# Patient Record
Sex: Male | Born: 1975 | Race: Black or African American | Hispanic: No | Marital: Single | Smoking: Current every day smoker
Health system: Southern US, Community
[De-identification: ages and names within clinical notes are randomized; demographics above are authoritative.]

## PROBLEM LIST (undated history)

## (undated) DIAGNOSIS — I10 Essential (primary) hypertension: Secondary | ICD-10-CM

## (undated) DIAGNOSIS — J45909 Unspecified asthma, uncomplicated: Secondary | ICD-10-CM

---

## 2001-08-05 ENCOUNTER — Emergency Department (HOSPITAL_COMMUNITY): Admission: EM | Admit: 2001-08-05 | Discharge: 2001-08-05 | Payer: Self-pay | Admitting: Internal Medicine

## 2004-05-08 ENCOUNTER — Emergency Department (HOSPITAL_COMMUNITY): Admission: EM | Admit: 2004-05-08 | Discharge: 2004-05-08 | Payer: Self-pay | Admitting: Emergency Medicine

## 2004-05-22 ENCOUNTER — Emergency Department (HOSPITAL_COMMUNITY): Admission: EM | Admit: 2004-05-22 | Discharge: 2004-05-22 | Payer: Self-pay | Admitting: Emergency Medicine

## 2004-09-30 ENCOUNTER — Ambulatory Visit (HOSPITAL_COMMUNITY): Admission: RE | Admit: 2004-09-30 | Discharge: 2004-09-30 | Payer: Self-pay | Admitting: Family Medicine

## 2004-10-01 ENCOUNTER — Ambulatory Visit: Payer: Self-pay | Admitting: Orthopedic Surgery

## 2004-10-16 ENCOUNTER — Ambulatory Visit: Payer: Self-pay | Admitting: Orthopedic Surgery

## 2007-03-07 ENCOUNTER — Emergency Department (HOSPITAL_COMMUNITY): Admission: EM | Admit: 2007-03-07 | Discharge: 2007-03-07 | Payer: Self-pay | Admitting: Emergency Medicine

## 2007-11-09 ENCOUNTER — Emergency Department (HOSPITAL_COMMUNITY): Admission: EM | Admit: 2007-11-09 | Discharge: 2007-11-09 | Payer: Self-pay | Admitting: Emergency Medicine

## 2007-11-22 ENCOUNTER — Emergency Department (HOSPITAL_COMMUNITY): Admission: EM | Admit: 2007-11-22 | Discharge: 2007-11-22 | Payer: Self-pay | Admitting: Emergency Medicine

## 2008-01-30 ENCOUNTER — Emergency Department (HOSPITAL_COMMUNITY): Admission: EM | Admit: 2008-01-30 | Discharge: 2008-01-30 | Payer: Self-pay | Admitting: Emergency Medicine

## 2008-05-11 ENCOUNTER — Emergency Department (HOSPITAL_COMMUNITY): Admission: EM | Admit: 2008-05-11 | Discharge: 2008-05-11 | Payer: Self-pay | Admitting: Emergency Medicine

## 2008-05-15 ENCOUNTER — Emergency Department (HOSPITAL_COMMUNITY): Admission: EM | Admit: 2008-05-15 | Discharge: 2008-05-16 | Payer: Self-pay | Admitting: Emergency Medicine

## 2008-11-03 ENCOUNTER — Emergency Department (HOSPITAL_COMMUNITY): Admission: EM | Admit: 2008-11-03 | Discharge: 2008-11-03 | Payer: Self-pay | Admitting: Emergency Medicine

## 2008-11-28 ENCOUNTER — Emergency Department (HOSPITAL_COMMUNITY): Admission: EM | Admit: 2008-11-28 | Discharge: 2008-11-28 | Payer: Self-pay | Admitting: Emergency Medicine

## 2008-12-29 ENCOUNTER — Emergency Department (HOSPITAL_COMMUNITY): Admission: EM | Admit: 2008-12-29 | Discharge: 2008-12-29 | Payer: Self-pay | Admitting: Emergency Medicine

## 2009-06-14 ENCOUNTER — Emergency Department (HOSPITAL_COMMUNITY): Admission: EM | Admit: 2009-06-14 | Discharge: 2009-06-14 | Payer: Self-pay | Admitting: Emergency Medicine

## 2009-08-31 IMAGING — CR DG FINGER THUMB 2+V*L*
1 series · 1 of 1 positions shown · non-contrast
Comparison: Images of the contralateral thumb dated 09/30/2004.

CLINICAL DATA: None the left thumb hyperextension injury while
playing basketball with some associated swelling.

LEFT THUMB 2+V

[view not recorded]
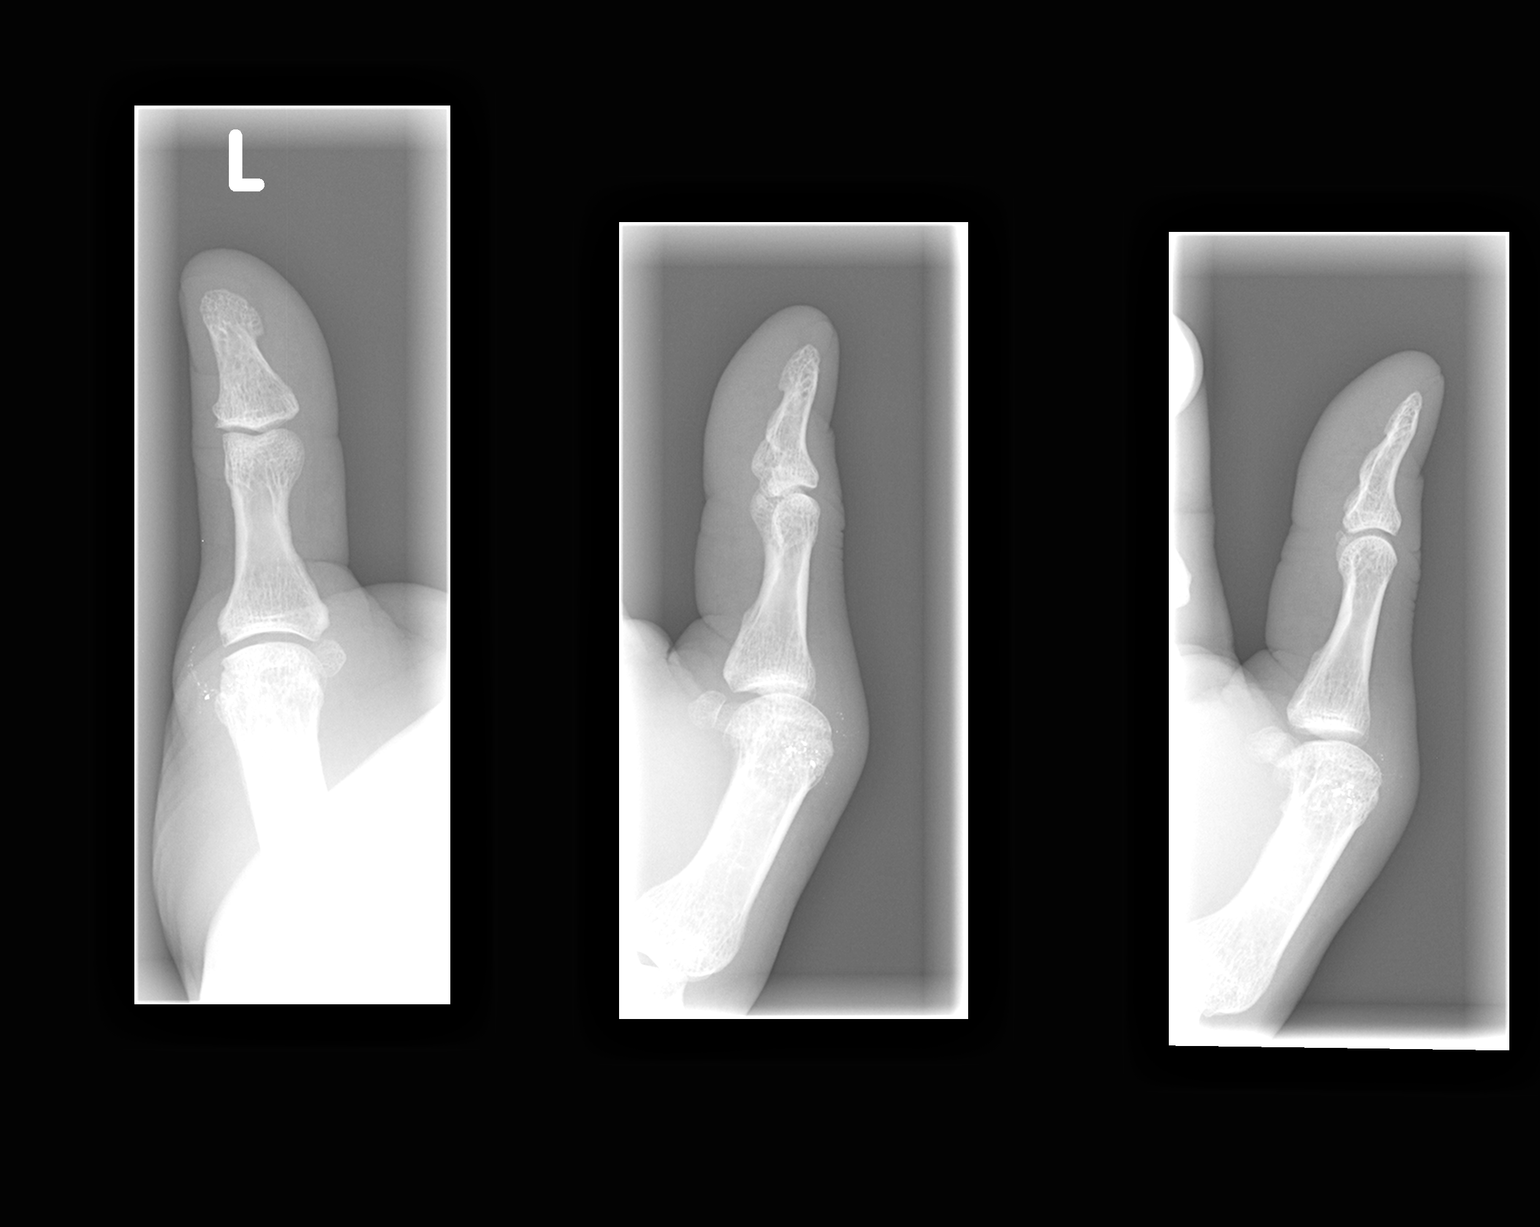

[1 of 1 positions shown; findings below may reference images not displayed]

FINDINGS: A small punctate radiodensities projecting superficial to
the distal head of the first metacarpal are noted, and there may
represent small metallic fragments or foreign body on the skin
surface.  Correlate with patient history and physical inspection.

No discrete fracture is identified.  There is some spurring along
the head of the first metacarpal, similar to the contralateral
side.
IMPRESSION: 1. Punctate radiodensities projecting in the soft tissues adjacent
to the head of the first metacarpal could represent small metallic
subcutaneous or skin surface foreign bodies.
2.  Irregularity of the head of the first metacarpal is likely due
to spurring, given that there is similar although less pronounced
findings on the contralateral side.  If the patient has point
tenderness directly over the head of the first metacarpal, then
further imaging workup with MRI may be warranted to exclude
radiographically occult fracture.

## 2009-12-24 ENCOUNTER — Emergency Department (HOSPITAL_COMMUNITY): Admission: EM | Admit: 2009-12-24 | Discharge: 2009-12-24 | Payer: Self-pay | Admitting: Emergency Medicine

## 2011-01-15 LAB — STREP A DNA PROBE: Group A Strep Probe: NEGATIVE

## 2011-01-15 LAB — RAPID STREP SCREEN (MED CTR MEBANE ONLY): Streptococcus, Group A Screen (Direct): NEGATIVE

## 2011-02-01 LAB — CBC
HCT: 45 % (ref 39.0–52.0)
HCT: 48.2 % (ref 39.0–52.0)
Hemoglobin: 15.7 g/dL (ref 13.0–17.0)
Hemoglobin: 16.8 g/dL (ref 13.0–17.0)
MCHC: 34.8 g/dL (ref 30.0–36.0)
MCHC: 34.9 g/dL (ref 30.0–36.0)
MCV: 97.5 fL (ref 78.0–100.0)
MCV: 97.9 fL (ref 78.0–100.0)
Platelets: 173 10*3/uL (ref 150–400)
Platelets: 175 10*3/uL (ref 150–400)
RBC: 4.62 MIL/uL (ref 4.22–5.81)
RBC: 4.92 MIL/uL (ref 4.22–5.81)
RDW: 14.1 % (ref 11.5–15.5)
RDW: 14.4 % (ref 11.5–15.5)
WBC: 8.3 10*3/uL (ref 4.0–10.5)
WBC: 8.7 10*3/uL (ref 4.0–10.5)

## 2011-02-01 LAB — DIFFERENTIAL
Basophils Absolute: 0 10*3/uL (ref 0.0–0.1)
Basophils Absolute: 0 10*3/uL (ref 0.0–0.1)
Basophils Relative: 0 % (ref 0–1)
Basophils Relative: 0 % (ref 0–1)
Eosinophils Absolute: 0.2 10*3/uL (ref 0.0–0.7)
Eosinophils Absolute: 0.2 10*3/uL (ref 0.0–0.7)
Eosinophils Relative: 2 % (ref 0–5)
Eosinophils Relative: 2 % (ref 0–5)
Lymphocytes Relative: 28 % (ref 12–46)
Lymphocytes Relative: 23 % (ref 12–46)
Lymphs Abs: 2.5 10*3/uL (ref 0.7–4.0)
Lymphs Abs: 1.9 10*3/uL (ref 0.7–4.0)
Monocytes Absolute: 0.7 10*3/uL (ref 0.1–1.0)
Monocytes Absolute: 0.8 10*3/uL (ref 0.1–1.0)
Monocytes Relative: 8 % (ref 3–12)
Monocytes Relative: 10 % (ref 3–12)
Neutro Abs: 5.4 10*3/uL (ref 1.7–7.7)
Neutro Abs: 5.3 10*3/uL (ref 1.7–7.7)
Neutrophils Relative %: 62 % (ref 43–77)
Neutrophils Relative %: 64 % (ref 43–77)

## 2011-02-01 LAB — BASIC METABOLIC PANEL
BUN: 9 mg/dL (ref 6–23)
BUN: 9 mg/dL (ref 6–23)
CO2: 24 mEq/L (ref 19–32)
CO2: 28 mEq/L (ref 19–32)
Calcium: 9.1 mg/dL (ref 8.4–10.5)
Calcium: 9.8 mg/dL (ref 8.4–10.5)
Chloride: 104 mEq/L (ref 96–112)
Chloride: 108 mEq/L (ref 96–112)
Creatinine, Ser: 0.92 mg/dL (ref 0.4–1.5)
Creatinine, Ser: 1.02 mg/dL (ref 0.4–1.5)
GFR calc Af Amer: >60 mL/min (ref >60)
GFR calc Af Amer: >60 mL/min (ref >60)
GFR calc non Af Amer: >60 mL/min (ref >60)
GFR calc non Af Amer: >60 mL/min (ref >60)
Glucose, Bld: 107 mg/dL — ABNORMAL HIGH (ref 70–99)
Glucose, Bld: 99 mg/dL (ref 70–99)
Potassium: 3.7 mEq/L (ref 3.5–5.1)
Potassium: 3.7 mEq/L (ref 3.5–5.1)
Sodium: 139 mEq/L (ref 135–145)
Sodium: 140 mEq/L (ref 135–145)

## 2011-02-01 LAB — APTT: aPTT: 26 seconds (ref 24–37)

## 2011-02-01 LAB — PROTIME-INR
INR: 1.1 (ref 0.00–1.49)
Prothrombin Time: 13.8 seconds (ref 11.6–15.2)

## 2011-02-06 LAB — GC/CHLAMYDIA PROBE AMP, GENITAL
Chlamydia, DNA Probe: NEGATIVE
GC Probe Amp, Genital: NEGATIVE

## 2011-02-10 LAB — URINALYSIS, ROUTINE W REFLEX MICROSCOPIC
Bilirubin Urine: NEGATIVE
Glucose, UA: NEGATIVE mg/dL
Hgb urine dipstick: NEGATIVE
Ketones, ur: NEGATIVE mg/dL
Nitrite: NEGATIVE
Protein, ur: NEGATIVE mg/dL
Specific Gravity, Urine: 1.015 (ref 1.005–1.030)
Urobilinogen, UA: 0.2 mg/dL (ref 0.0–1.0)
pH: 8 (ref 5.0–8.0)

## 2011-02-10 LAB — GC/CHLAMYDIA PROBE AMP, GENITAL
Chlamydia, DNA Probe: NEGATIVE
GC Probe Amp, Genital: NEGATIVE

## 2011-02-11 LAB — GC/CHLAMYDIA PROBE AMP, GENITAL
Chlamydia, DNA Probe: NEGATIVE
GC Probe Amp, Genital: NEGATIVE

## 2011-02-11 LAB — URINALYSIS, ROUTINE W REFLEX MICROSCOPIC
Bilirubin Urine: NEGATIVE
Glucose, UA: NEGATIVE mg/dL
Hgb urine dipstick: NEGATIVE
Ketones, ur: NEGATIVE mg/dL
Nitrite: NEGATIVE
Protein, ur: NEGATIVE mg/dL
Specific Gravity, Urine: 1.02 (ref 1.005–1.030)
Urobilinogen, UA: 0.2 mg/dL (ref 0.0–1.0)
pH: 7.5 (ref 5.0–8.0)

## 2011-02-11 LAB — GC/CHLAMYDIA PROBE AMP, URINE
Chlamydia, Swab/Urine, PCR: NEGATIVE
GC Probe Amp, Urine: NEGATIVE

## 2011-03-11 NOTE — H&P (Signed)
[name], [name]                  ACCOUNT NO.:  [id number]   MEDICAL RECORD NO.:  [id number]          PATIENT TYPE:  EMS   LOCATION:  ED                            FACILITY:  [facility]   PHYSICIAN:  Osvaldo Shipper, MD     DATE OF BIRTH:  1976/09/11   DATE OF ADMISSION:  06/14/2009  DATE OF DISCHARGE:  LH                              HISTORY & PHYSICAL   PRIMARY CARE PHYSICIAN:  Catalina Pizza, M.D.   ADMITTING DIAGNOSES:  1. Hematochezia.  2. Recent cocaine use.   CHIEF COMPLAINT:  Blood in the stool.   HISTORY OF PRESENT ILLNESS:  The patient is a 35 year old African  American male who was in his usual state of health until a few hours  ago, when he had a bowel movement and he said the toilet was filled with  blood.  It was apparently pink to red in color.  There was no blood  clots. When he wiped, there was red blood on the tissure.  This happened  a few days ago as well; when he wiped after having a BM, he found blood  on the tissue.  He was also complaining of some lower abdominal pain  which comes and goes, and has been ongoing for the last month or so.  He  denies any nausea or vomiting.  He had one episode of black stools many  days ago.  He denies any fever or chills.  He has been having diarrhea  for the past 2 months, which he describes as soft stool.  He has soft  stools every day.  Most of the stools are brown in color.  No history of  any colonoscopies or EGDs.   MEDICATIONS AT HOME:  None.  No over-the-counter pain medicines.   ALLERGIES:  None.   PAST MEDICAL HISTORY:  He said he has been told he has hemorrhoids, but  otherwise denies any other medical problems.  Denies any surgeries.   SOCIAL HISTORY:  Unemployed.  Currently smokes cigars, one to two every  day.  Drinks two to three times a week, drinks vodka.  He did cocaine  this morning.  Denies any other illicit drug use.   FAMILY HISTORY:  Unremarkable.   REVIEW OF SYSTEMS:  GENERAL:  There is some positive  weakness and  malaise.  HEENT: Unremarkable.  CARDIOVASCULAR:  Unremarkable.  RESPIRATORY:  Unremarkable.  GI: As in HPI.  GU: Positive for STDs in  the past.  NEUROLOGIC:  Unremarkable.  PSYCHIATRIC:  Unremarkable.  DERMATOLOGIC:  Unremarkable.  MUSCULOSKELETAL:  Unremarkable.  Other  systems unremarkable.   PHYSICAL EXAMINATION:  VITAL SIGNS:  Temperature 98.3, blood pressure  141/82, heart rate 110 and regular, respiratory rate 20, saturation 97%  on room air.  GENERAL:  This is a well-developed, well-nourished black male in no  distress.  HEENT:  There is no pallor, no icterus.  Oral mucosal  membranes are moist.  No oral lesions are noted.  Head is normocephalic,  atraumatic.  NECK:  Soft and supple.  No thyromegaly is appreciated.  No  lymphadenopathy is noted.  LUNGS:  Clear to auscultation bilaterally.  No wheezing, rales or  rhonchi.  No dullness to percussion.  CARDIOVASCULAR:  S1 and S2 is normal and regular.  No S3 or S4.  No  rubs.  No bruits.  ABDOMEN:  Soft, nontender, nondistended.  Bowel sounds are present.  No  masses or organomegaly is appreciated.  However, he did have some vague  suprapubic tenderness.  RECTAL:  This was done by the ED physician, which showed blood in the  vault.  No stool was present.  GU:  Examination deferred at this time.  MUSCULOSKELETAL:  Exam unremarkable.  NEUROLOGIC:  He is alert, oriented x3.  No focal neurological deficits  are present.  DERMATOLOGIC:  Examination was unremarkable for any rash.   LAB DATA:  His CBC is normal.  Coags are normal.  BMET is normal.  No  imaging studies have been done.   ASSESSMENT:  This is a 35 year old African American male with history of  bright red blood per rectum.  The reason for this hematochezia is most  likely hemorrhoids.  However, he did admit to using cocaine, and hence  differential also includes cocaine-induced ischemia causing  gastrointestinal bleeding.   PLAN:  1. Hematochezia:   Most likely hemorrhoidal; however, the cocaine use      does confound the issues.  He is also a little bit tachycardic.  If      there was no cocaine use, I think this patient could have been      easily sent home with follow-up, with either his PMD or      gastroenterology.  However, with the cocaine use I am concerned      about the recurrence of bleeding and quick decompensation.  In view      of this, I offered the patient 24-hour observation in the hospital      with serial H&H; and if they remain stable and the patient remained      stable, he should be able to go home after that.  The patient;      however, was very reluctant to stay.  I have explained to him the      perils of not staying in the hospital.  I have told him that the      effect of cocaine usually lasts quite a many hours.  However, the      patient at this time wants to leave against medical advice.  If he      changes his mind we will be happy to admit him.  Will put him on      PPI.  Will get a GI consult, so that they can follow him up as an      outpatient.  A urine drug screen will be checked.  He will be      actively monitored on telemetry if he decides to say.  2. Deep vein thrombosis prophylaxis in the form of SCDs will be      provided.   Once again, the patient wishes to leave against medical advice.  I will  advise the nurse to provide the patient with the phone number for Dr.  Kendell Bane, as his doctor, Dr. Margo Aye is out of town.  I have told the nurse  to tell the patient that if he has recurrence of bleeding, he can call  Dr. Kendell Bane or come back to the emergency department.      Osvaldo Shipper, MD  Electronically Signed     GK/MEDQ  D:  06/14/2009  T:  06/14/2009  Job:  [id number]   cc:   Catalina Pizza, M.D.  Fax: [phone]

## 2011-07-16 LAB — URINALYSIS, ROUTINE W REFLEX MICROSCOPIC
Bilirubin Urine: NEGATIVE
Glucose, UA: NEGATIVE
Nitrite: NEGATIVE
Protein, ur: NEGATIVE
Specific Gravity, Urine: 1.025
Urobilinogen, UA: 0.2
pH: 6

## 2011-07-16 LAB — URINE MICROSCOPIC-ADD ON

## 2011-07-16 LAB — RPR: RPR Ser Ql: NONREACTIVE

## 2011-07-16 LAB — GC/CHLAMYDIA PROBE AMP, GENITAL
Chlamydia, DNA Probe: NEGATIVE
GC Probe Amp, Genital: POSITIVE — AB

## 2011-07-17 LAB — URINALYSIS, ROUTINE W REFLEX MICROSCOPIC
Bilirubin Urine: NEGATIVE
Glucose, UA: NEGATIVE
Ketones, ur: 15 — AB
Leukocytes, UA: NEGATIVE
Nitrite: NEGATIVE
Protein, ur: 30 — AB
Specific Gravity, Urine: 1.03
Urobilinogen, UA: 0.2
pH: 5.5

## 2011-07-17 LAB — BASIC METABOLIC PANEL
CO2: 24
Chloride: 101
GFR calc Af Amer: >60
Potassium: 3.7
Sodium: 133 — ABNORMAL LOW

## 2011-07-17 LAB — CBC
HCT: 49.2
Hemoglobin: 17
MCHC: 34.6
MCV: 93.9
RBC: 5.24
WBC: 7

## 2011-07-17 LAB — DIFFERENTIAL
Basophils Relative: 0
Eosinophils Absolute: 0.1
Lymphs Abs: 1.4
Monocytes Absolute: 0.9
Monocytes Relative: 13 — ABNORMAL HIGH

## 2011-07-22 LAB — URINALYSIS, ROUTINE W REFLEX MICROSCOPIC
Bilirubin Urine: NEGATIVE
Glucose, UA: NEGATIVE
Protein, ur: NEGATIVE
Urobilinogen, UA: 0.2

## 2011-07-22 LAB — URINE MICROSCOPIC-ADD ON

## 2011-07-22 LAB — GC/CHLAMYDIA PROBE AMP, GENITAL
Chlamydia, DNA Probe: NEGATIVE
GC Probe Amp, Genital: NEGATIVE

## 2011-07-22 LAB — RPR: RPR Ser Ql: NONREACTIVE

## 2011-07-25 LAB — URINALYSIS, ROUTINE W REFLEX MICROSCOPIC
Bilirubin Urine: NEGATIVE
Glucose, UA: NEGATIVE
Ketones, ur: NEGATIVE
Protein, ur: NEGATIVE
pH: 6

## 2011-07-25 LAB — URINE MICROSCOPIC-ADD ON

## 2011-07-25 LAB — GC/CHLAMYDIA PROBE AMP, GENITAL: Chlamydia, DNA Probe: NEGATIVE

## 2011-09-22 ENCOUNTER — Emergency Department (HOSPITAL_COMMUNITY): Payer: No Typology Code available for payment source

## 2011-09-22 ENCOUNTER — Emergency Department (HOSPITAL_COMMUNITY)
Admission: EM | Admit: 2011-09-22 | Discharge: 2011-09-22 | Disposition: A | Discharge status: Home / Self Care | Payer: No Typology Code available for payment source | Attending: Emergency Medicine | Admitting: Emergency Medicine

## 2011-09-22 ENCOUNTER — Encounter: Payer: Self-pay | Admitting: Emergency Medicine

## 2011-09-22 DIAGNOSIS — IMO0002 Reserved for concepts with insufficient information to code with codable children: Secondary | ICD-10-CM | POA: Insufficient documentation

## 2011-09-22 DIAGNOSIS — M545 Low back pain, unspecified: Secondary | ICD-10-CM | POA: Insufficient documentation

## 2011-09-22 DIAGNOSIS — R209 Unspecified disturbances of skin sensation: Secondary | ICD-10-CM | POA: Insufficient documentation

## 2011-09-22 DIAGNOSIS — M542 Cervicalgia: Secondary | ICD-10-CM | POA: Insufficient documentation

## 2011-09-22 DIAGNOSIS — M546 Pain in thoracic spine: Secondary | ICD-10-CM | POA: Insufficient documentation

## 2011-09-22 MED ORDER — OXYCODONE-ACETAMINOPHEN 5-325 MG PO TABS: 2.0000 | ORAL_TABLET | ORAL | Status: AC | PRN

## 2011-09-22 MED ORDER — METHOCARBAMOL 500 MG PO TABS: 500.0000 mg | ORAL_TABLET | Freq: Two times a day (BID) | ORAL | Status: AC

## 2011-09-22 MED ORDER — DIAZEPAM 5 MG PO TABS
5.0000 mg | ORAL_TABLET | Freq: Once | ORAL | Status: AC
  Administered 2011-09-22: 5 mg via ORAL
  Filled 2011-09-22: qty 1

## 2011-09-22 MED ORDER — HYDROCODONE-ACETAMINOPHEN 5-325 MG PO TABS
2.0000 | ORAL_TABLET | Freq: Once | ORAL | Status: AC
  Administered 2011-09-22: 2 via ORAL
  Filled 2011-09-22: qty 2
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
## 2014-04-04 ENCOUNTER — Emergency Department (HOSPITAL_COMMUNITY)
Admission: EM | Admit: 2014-04-04 | Discharge: 2014-04-04 | Disposition: A | Discharge status: Home / Self Care | Payer: Self-pay | Attending: Emergency Medicine | Admitting: Emergency Medicine

## 2014-04-04 ENCOUNTER — Encounter (HOSPITAL_COMMUNITY): Payer: Self-pay | Admitting: Emergency Medicine

## 2014-04-04 ENCOUNTER — Emergency Department (HOSPITAL_COMMUNITY): Payer: Self-pay

## 2014-04-04 DIAGNOSIS — R0982 Postnasal drip: Secondary | ICD-10-CM | POA: Insufficient documentation

## 2014-04-04 DIAGNOSIS — J029 Acute pharyngitis, unspecified: Secondary | ICD-10-CM | POA: Insufficient documentation

## 2014-04-04 DIAGNOSIS — F17200 Nicotine dependence, unspecified, uncomplicated: Secondary | ICD-10-CM | POA: Insufficient documentation

## 2014-04-04 DIAGNOSIS — Z79899 Other long term (current) drug therapy: Secondary | ICD-10-CM | POA: Insufficient documentation

## 2014-04-04 MED ORDER — PREDNISONE 10 MG PO TABS: ORAL_TABLET | ORAL | Status: DC

## 2014-04-04 MED ORDER — ALBUTEROL SULFATE HFA 108 (90 BASE) MCG/ACT IN AERS
2.0000 | INHALATION_SPRAY | Freq: Once | RESPIRATORY_TRACT | Status: AC
  Administered 2014-04-04: 2 via RESPIRATORY_TRACT
  Filled 2014-04-04: qty 6.7

## 2014-04-04 MED ORDER — CETIRIZINE-PSEUDOEPHEDRINE ER 5-120 MG PO TB12: 1.0000 | ORAL_TABLET | Freq: Two times a day (BID) | ORAL | Status: DC

## 2014-04-04 NOTE — ED Notes (Signed)
Pt c/o sore throat, spitting up blood tinges mucous in the mornings that started memorial day weekend, denies any fever,

## 2014-04-04 NOTE — ED Provider Notes (Signed)
CSN: [id number]     Arrival date & time 04/04/14  1315 History   First MD Initiated Contact with Patient 04/04/14 1337     Chief Complaint  Patient presents with  . Sore Throat     (Consider location/radiation/quality/duration/timing/severity/associated sxs/prior Treatment) HPI Comments: Charles Herrera is a 38 y.o. Male presenting with a 2 week history of sore throat,  Post nasal drip,  Hoarseness of voice and clearing blood tinged sputum when he clears his throat, mostly when he first wakes in the morning.  He denies nosebleeds.  He is a smoker, but cutting back,  Currently smoking about 5 cigarettes daily.  He has occasional wheezing without significant shortness of breath as well.  He denies chest pain,  Fevers or chills, weakness or dizziness.  He denies recent respiratory illnesses and no current nasal congestion or sinus pain.  He has used peroxide gargle without relief.     The history is provided by the patient.    History reviewed. No pertinent past medical history. History reviewed. No pertinent past surgical history. History reviewed. No pertinent family history. History  Substance Use Topics  . Smoking status: Current Every Day Smoker -- 0.50 packs/day    Types: Cigarettes  . Smokeless tobacco: Not on file  . Alcohol Use: Yes    Review of Systems  Constitutional: Negative for fever and chills.  HENT: Positive for postnasal drip, sore throat and voice change. Negative for congestion, ear discharge, ear pain, facial swelling, nosebleeds and trouble swallowing.   Eyes: Negative.   Respiratory: Positive for wheezing. Negative for chest tightness and shortness of breath.   Cardiovascular: Negative for chest pain.  Gastrointestinal: Negative for nausea and abdominal pain.  Genitourinary: Negative.   Musculoskeletal: Negative for arthralgias, joint swelling and neck pain.  Skin: Negative.  Negative for rash and wound.  Neurological: Negative for dizziness, weakness,  light-headedness, numbness and headaches.  Psychiatric/Behavioral: Negative.       Allergies  Review of patient's allergies indicates no known allergies.  Home Medications   Prior to Admission medications   Medication Sig Start Date End Date Taking? Authorizing Provider  cetirizine-pseudoephedrine (ZYRTEC-D) 5-120 MG per tablet Take 1 tablet by mouth 2 (two) times daily. 04/04/14   Burgess Amor, PA-C  predniSONE (DELTASONE) 10 MG tablet 6, 5, 4, 3, 2 then 1 tablet by mouth daily for 6 days total. 04/04/14   Burgess Amor, PA-C   BP 146/93  Pulse 91  Temp(Src) 98.6 F (37 C) (Oral)  Resp 20  Ht 5\' 11"  (1.803 m)  Wt 200 lb (90.719 kg)  BMI 27.91 kg/m2  SpO2 99% Physical Exam  Constitutional: He is oriented to person, place, and time. He appears well-developed and well-nourished.  HENT:  Head: Normocephalic and atraumatic.  Right Ear: Tympanic membrane, external ear and ear canal normal.  Left Ear: Tympanic membrane, external ear and ear canal normal.  Nose: Mucosal edema present. No rhinorrhea. Right sinus exhibits no maxillary sinus tenderness and no frontal sinus tenderness. Left sinus exhibits no maxillary sinus tenderness and no frontal sinus tenderness.  Mouth/Throat: Uvula is midline, oropharynx is clear and moist and mucous membranes are normal. No oropharyngeal exudate, posterior oropharyngeal edema, posterior oropharyngeal erythema or tonsillar abscesses.  Moderate mucosal edema without total occlusion  Eyes: Conjunctivae are normal.  Neck: No tracheal tenderness present. No edema present. No mass and no thyromegaly present.  Mild hoarseness of voice,  No stridor.  Cardiovascular: Normal rate, regular rhythm and normal heart sounds.  Pulmonary/Chest: Effort normal. No stridor. No respiratory distress. He has no decreased breath sounds. He has no wheezes. He has no rhonchi. He has no rales.  Abdominal: Soft. There is no tenderness.  Musculoskeletal: Normal range of motion.   Lymphadenopathy:    He has no cervical adenopathy.  No head adenopathy  Neurological: He is alert and oriented to person, place, and time.  Skin: Skin is warm and dry. No rash noted.  Psychiatric: He has a normal mood and affect.    ED Course  Procedures (including critical care time) Labs Review Labs Reviewed - No data to display  Imaging Review Dg Chest 2 View  04/04/2014   CLINICAL DATA:  Sore throat.  Nasal drainage.  Cigarette smoker.  EXAM: CHEST  2 VIEW  COMPARISON:  None.  FINDINGS: Cardiopericardial silhouette within normal limits. Mediastinal contours normal. Trachea midline. No airspace disease or effusion.  IMPRESSION: No active cardiopulmonary disease.   Electronically Signed   By: Andreas NewportGeoffrey  Lamke M.D.   On: 04/04/2014 15:06     EKG Interpretation None      MDM   Final diagnoses:  Pharyngitis  Post-nasal drip    Patients labs and/or radiological studies were viewed and considered during the medical decision making and disposition process. Pt was advised smoking cessation.  He was given an albuterol mdi, prescribed zyrtec D, prednisone for pharyngitis/inflammation.  Discussed need for recheck by ENT if he continues to have sx.  Pt understands plan.  Referral to Dr. Suszanne Connerseoh prn.   No exam findings suggesting infectious process.    Burgess AmorJulie Bryceton Hantz, PA-C 04/04/14 [phone]

## 2014-04-04 NOTE — ED Notes (Signed)
Sore throat for 2 week,    On awakening has mucus in throat, and has blood in it.

## 2014-04-04 NOTE — ED Notes (Signed)
PT c/o sorethroat and nasal drainage into throat a yellow color x2 weeks. PT throat red and irritated on exam.

## 2014-04-04 NOTE — ED Notes (Signed)
Instructions given on inhaler use and spacer given for inhaler. Return demonstration given by patient.

## 2014-04-04 NOTE — ED Provider Notes (Signed)
Medical screening examination/treatment/procedure(s) were performed by non-physician practitioner and as supervising physician I was immediately available for consultation/collaboration.   EKG Interpretation None       Chaunice Obie, MD 04/04/14 2013 

## 2014-04-04 NOTE — Discharge Instructions (Signed)
Pharyngitis Pharyngitis is redness, pain, and swelling (inflammation) of your pharynx.  CAUSES  Pharyngitis is usually caused by infection. Most of the time, these infections are from viruses (viral) and are part of a cold. However, sometimes pharyngitis is caused by bacteria (bacterial). Pharyngitis can also be caused by allergies. Viral pharyngitis may be spread from person to person by coughing, sneezing, and personal items or utensils (cups, forks, spoons, toothbrushes). Bacterial pharyngitis may be spread from person to person by more intimate contact, such as kissing.  SIGNS AND SYMPTOMS  Symptoms of pharyngitis include:   Sore throat.   Tiredness (fatigue).   Low-grade fever.   Headache.  Joint pain and muscle aches.  Skin rashes.  Swollen lymph nodes.  Plaque-like film on throat or tonsils (often seen with bacterial pharyngitis). DIAGNOSIS  Your health care provider will ask you questions about your illness and your symptoms. Your medical history, along with a physical exam, is often all that is needed to diagnose pharyngitis. Sometimes, a rapid strep test is done. Other lab tests may also be done, depending on the suspected cause.  TREATMENT  Viral pharyngitis will usually get better in 3 4 days without the use of medicine. Bacterial pharyngitis is treated with medicines that kill germs (antibiotics).  HOME CARE INSTRUCTIONS   Drink enough water and fluids to keep your urine clear or pale yellow.   Only take over-the-counter or prescription medicines as directed by your health care provider:   If you are prescribed antibiotics, make sure you finish them even if you start to feel better.   Do not take aspirin.   Get lots of rest.   Gargle with 8 oz of salt water ( tsp of salt per 1 qt of water) as often as every 1 2 hours to soothe your throat.   Throat lozenges (if you are not at risk for choking) or sprays may be used to soothe your throat. SEEK MEDICAL  CARE IF:   You have large, tender lumps in your neck.  You have a rash.  You cough up green, yellow-brown, or bloody spit. SEEK IMMEDIATE MEDICAL CARE IF:   Your neck becomes stiff.  You drool or are unable to swallow liquids.  You vomit or are unable to keep medicines or liquids down.  You have severe pain that does not go away with the use of recommended medicines.  You have trouble breathing (not caused by a stuffy nose). MAKE SURE YOU:   Understand these instructions.  Will watch your condition.  Will get help right away if you are not doing well or get worse. Document Released: 10/13/2005 Document Revised: 08/03/2013 Document Reviewed: 06/20/2013 [facility] Patient Information 2014 [place]ExitCare, [place]LLC.   Emergency Department Resource Guide 1) Find a Doctor and Pay Out of Pocket Although you won't have to find out who is covered by your insurance plan, it is a good idea to ask around and get recommendations. You will then need to call the office and see if the doctor you have chosen will accept you as a new patient and what types of options they offer for patients who are self-pay. Some doctors offer discounts or will set up payment plans for their patients who do not have insurance, but you will need to ask so you aren't surprised when you get to your appointment.  2) Contact Your Local Health Department Not all health departments have doctors that can see patients for sick visits, but many do, so it is worth a  call to see if yours does. If you don't know where your local health department is, you can check in your phone book. The CDC also has a tool to help you locate your state's health department, and many state websites also have listings of all of their local health departments.  3) Find a Walk-in Clinic If your illness is not likely to be very severe or complicated, you may want to try a walk in clinic. These are popping up all over the country in pharmacies, drugstores, and  shopping centers. They're usually staffed by nurse practitioners or physician assistants that have been trained to treat common illnesses and complaints. They're usually fairly quick and inexpensive. However, if you have serious medical issues or chronic medical problems, these are probably not your best option.  No Primary Care Doctor: - Call Health Connect at  [phone] - they can help you locate a primary care doctor that  accepts your insurance, provides certain services, etc. - Physician Referral Service- [phone]  Chronic Pain Problems: Organization         Address  Phone   Notes  [facility]  [phone] Patients need to be referred by their primary care doctor.   Medication Assistance: Organization         Address  Phone   Notes  [facility] Medication [facility] [address] [place], [place] [address] [phone] --Must be a resident of [facility] -- Must have NO insurance coverage whatsoever (no Medicaid/ Medicare, etc.) -- The pt. MUST have a primary care doctor that directs their care regularly and follows them in the community   [facility]ist  [phone]   [employer]  [phone]    Agencies that provide inexpensive medical care: Organization         Address  Phone   Notes  [facility]  [phone]   [facility]    [phone]   [facility] [address] [place], [place] [address] [phone]   [facility] [address] [place] [place] [phone]   Planned Parenthood    [phone]   [facility]    [phone]   [facility] and [facility]  [address], [place] Phone:  [phone], Fax:  [phone] Hours of Operation:  9 am - 6 pm, M-F.  Also accepts Medicaid/Medicare and self-pay.  [facility]  [address], [place] Phone: [phone],  Fax: [phone]. Hours of Operation:  8:30 am - 5:30 pm, M-F.  Also accepts Medicaid and self-pay.  [facility] [place] [address], [place] Phone: [phone]   [facility] [address] Natasha Bence [place], [place] [phone], Ext. [phone] Mondays & Thursdays: 7-9 AM.  First 15 patients are seen on a first come, first serve basis.    Medicaid-accepting [facility] Providers:  Organization         Address  Phone   Notes  [facility] [address], [place] [phone] Also accepts self-pay patients.  [facility] [address] Laurell Josephs [place], [place]  [phone]   [facility] [address], [place] [phone]   [facility] [address] [place] [phone]   Renaye Rakers [address], [place],  [place]   [phone] Only accepts [place] Goldman Sachsccess Medicaid patients after they have their name applied to their card.   Self-Pay (no insurance) in [facility]:  Organization         Address  Phone   Notes  Sickle Cell Patients, [facility] [address], [place] [phone]   [facility] [address], [place] [phone]   [facility] [place]  [address], [place] [phone]   [facility]/Dr. Osei-Bonsu  [address] [place] [address], [place] or [phone], [place] [phone] Phone number for both [place] and [place] locations is the same.  [facility] [address], [place] [phone]   [facility] [place] [address] [place] [address], [place] or [address] [phone]   [facility] [address], [place] [phone], phone; [phone], fax Sees patients 1st and 3rd Saturday of every month.  Must not qualify for public or private  insurance (i.e. Medicaid, Medicare, [place] Health Choice, Veterans' Benefits)  Household income should be no more than 200% of the poverty level The clinic cannot treat you if you are pregnant or think you are pregnant  Sexually transmitted diseases are not treated at the clinic.    Dental Care: Organization         Address  Phone  Notes  [facility] [address], [place] [phone]) [phone] Accepts children up to age 40 who are enrolled in [place]icaid or [place] Health Choice; pregnant women with a Medicaid card; and children who have applied for Medicaid or [place] Health Choice, but were declined, whose parents can pay a reduced fee at time of service.  [facility] [place]  [address], [place] [phone] Accepts children up to age 40 who are enrolled in [place]icaid or [place] Health Choice; pregnant women with a Medicaid card; and children who have applied for Medicaid or [place] Health Choice, but were declined, whose parents can pay a reduced fee at time of service.  [facility]  [address], [place] [phone] Patients are seen by appointment only. Walk-ins are not accepted. [facility] will see patients 40 years of age and older. Monday - Tuesday (8am-5pm) Most Wednesdays (8:30-5pm) $30 per visit, cash only  [facility]  [address], [facility] [phone] Patients are seen by appointment only. Walk-ins are not accepted. [facility] will see patients 40 years of age and older. One Wednesday Evening (Monthly: Volunteer Based).  $30 per visit, cash only  [employer] [facility] [facility]  [phone] for adults; Children under age 714, call Graduate Pediatric Dentistry at [phone]. Children aged 204-14, please call [phone] to request a pediatric application.  Dental services are provided in all areas of dental care  including fillings, crowns and bridges, complete and partial dentures, implants, gum treatment, root canals, and extractions. Preventive care is also provided. Treatment is provided to both adults and children. Patients are selected via a lottery and there is often a waiting list.   [facility] [address], [place]  [phone] [email or web address]   [facility] [address], [place], [place] [phone], Ext. [phone] Second and Fourth Thursday of each month,  opens at 6:30 AM; Clinic ends at 9 AM.  Patients are seen on a first-come first-served basis, and a limited number are seen during each clinic.   [facility]  [address], [place], [place] [phone]   Eligibility Requirements You must have lived in [place], [place], or [place] counties for at least the last three months.   You cannot be eligible for state or federal sponsored National Cityhealthcare insurance, including CIGNAVeterans Administration, [place]icaid, or Harrah's EntertainmentMedicare.   You generally cannot be eligible for healthcare insurance through your employer.    How to apply: Eligibility screenings are held every Tuesday and Wednesday afternoon from 1:00 pm until 4:00 pm. You do not need an appointment for the interview!  [facility] [address], [place], [place] [phone]   [facility]  [phone]   [facility]  [phone]   [facility]  [phone]    Behavioral Health Resources in the Community: Intensive Outpatient Programs Organization         Address  Phone  Notes  [facility] [address], [place], [place] [phone]   [facility] [address], [place], [place] [phone]   [facility] [address], [place], [place]  [phone]   [facility] [address],  [place], [place] [phone] or [phone]     Substance Abuse Resources Organization         Address  Phone  Notes  [facility]  [phone]   [facility]  [phone]   [facility]  [phone]   [facility]  [phone]   Residential & Outpatient Substance Abuse Program  [phone]   Psychological Services Organization         Address  Phone  Notes  [facility]  [phone]- [phone]   [facility]  [phone]- [phone]   [facility] [address], [place] [phone] or [phone]) [phone]    Mobile Crisis Teams Organization         Address  Phone  Notes  [facility], Mobile Crisis Care Unit  [phone]   [facility]  [address] [place], [place] [phone]   Doristine LocksSharon DeEsch [address] [place] [phone]    Self-Help/Support Groups Organization         Address  Phone             Notes  [facility] [place] - variety of support groups  [phone]- [phone] Call for more information  Narcotics Anonymous (NA), Caring [facility] [address], [place]  2 meetings at this location   Statisticianesidential Treatment Programs Organization         Address  Phone  Notes  [facility] [address],    [place]  [phone]   [facility]  [address], [place] [phone], [place], [place] [phone]   [facility] [address], [place] [phone] Admissions: 8am-3pm M-F  [facility] [address]    [place], [place] [phone]   [facility] [address], [place], [place] [phone]   [facility] [address]  [place], [place] [phone]   [facility] - Intensive Outpatient [address], [place], [place] [phone]   [facility] ([facility] [address]  [place], [place]  [phone] or [phone]   [facility]  Services (RTS) [address] [place], [place] [phone] Accepts Medicaid  [facility] [place] [address]  [place] [phone] Substance Abuse/Addiction Treatment   [facility] Organization         Address  Phone  Notes  [facility]  [phone]   Angie Fava, PhD [address] Ervin Knack [place], [place]   [phone] or [phone]   [facility]   [address] [place], [place] [phone]   [facility] [address] [place], [place] [phone] Insurance/Medicaid/sponsorship through [facility] and Families [address]                                    [place], [place] [phone] Therapy/tele-psych/case  [facility] [address] [place], [place] [phone]    Dr. Lolly Mustache  [phone]   [facility]  [employer] [facility] Dept. 1) [address] [place] 2) [address], [place] 3)  [address], [place] [phone]  [phone]   [facility] Child Abuse Hotline 202-657-3117 or 682-328-8146 (After Hours)         Take the medicines prescribed.  Use the inhaler given - 2 puffs every 4 hours if you are wheezing or short of breath.  You may need to see an ear, nose and throat specialist if your symptoms persist - call Dr Suszanne Conners for this followup as needed.

## 2014-05-11 ENCOUNTER — Encounter (HOSPITAL_COMMUNITY): Payer: Self-pay | Admitting: Emergency Medicine

## 2014-05-11 ENCOUNTER — Emergency Department (HOSPITAL_COMMUNITY): Payer: Self-pay

## 2014-05-11 ENCOUNTER — Emergency Department (HOSPITAL_COMMUNITY)
Admission: EM | Admit: 2014-05-11 | Discharge: 2014-05-11 | Disposition: A | Discharge status: Home / Self Care | Payer: Self-pay | Attending: Emergency Medicine | Admitting: Emergency Medicine

## 2014-05-11 DIAGNOSIS — S62319A Displaced fracture of base of unspecified metacarpal bone, initial encounter for closed fracture: Secondary | ICD-10-CM | POA: Insufficient documentation

## 2014-05-11 DIAGNOSIS — Y9389 Activity, other specified: Secondary | ICD-10-CM | POA: Insufficient documentation

## 2014-05-11 DIAGNOSIS — Y929 Unspecified place or not applicable: Secondary | ICD-10-CM | POA: Insufficient documentation

## 2014-05-11 DIAGNOSIS — F17200 Nicotine dependence, unspecified, uncomplicated: Secondary | ICD-10-CM | POA: Insufficient documentation

## 2014-05-11 DIAGNOSIS — S62339A Displaced fracture of neck of unspecified metacarpal bone, initial encounter for closed fracture: Secondary | ICD-10-CM

## 2014-05-11 DIAGNOSIS — IMO0002 Reserved for concepts with insufficient information to code with codable children: Secondary | ICD-10-CM | POA: Insufficient documentation

## 2014-05-11 MED ORDER — HYDROCODONE-ACETAMINOPHEN 5-325 MG PO TABS: 1.0000 | ORAL_TABLET | Freq: Four times a day (QID) | ORAL | Status: DC | PRN

## 2014-05-11 MED ORDER — HYDROCODONE-ACETAMINOPHEN 5-325 MG PO TABS
2.0000 | ORAL_TABLET | Freq: Once | ORAL | Status: AC
  Administered 2014-05-11: 2 via ORAL
  Filled 2014-05-11: qty 2

## 2014-05-11 MED ORDER — IBUPROFEN 600 MG PO TABS: 600.0000 mg | ORAL_TABLET | Freq: Three times a day (TID) | ORAL | Status: DC | PRN

## 2014-05-11 MED ORDER — HYDROMORPHONE HCL PF 1 MG/ML IJ SOLN
1.0000 mg | Freq: Once | INTRAMUSCULAR | Status: AC
  Administered 2014-05-11: 1 mg via INTRAMUSCULAR
  Filled 2014-05-11: qty 1

## 2014-05-11 MED ORDER — IBUPROFEN 400 MG PO TABS
600.0000 mg | ORAL_TABLET | Freq: Once | ORAL | Status: AC
  Administered 2014-05-11: 600 mg via ORAL
  Filled 2014-05-11: qty 2

## 2014-05-11 NOTE — Discharge Instructions (Signed)
Boxer's Fracture You have a break (fracture) of the fifth metacarpal bone. This is commonly called a boxer's fracture. This is the bone in the hand where the little finger attaches. The fracture is in the end of that bone, closest to the little finger. It is usually caused when you hit an object with a clenched fist. Often, the knuckle is pushed down by the impact. Sometimes, the fracture rotates out of position. A boxer's fracture will usually heal within 6 weeks, if it is treated properly and protected from re-injury. Surgery is sometimes needed. A cast, splint, or bulky hand dressing may be used to protect and immobilize a boxer's fracture. Do not remove this device or dressing until your caregiver approves. Keep your hand elevated, and apply ice packs for 15-20 minutes every 2 hours, for the first 2 days. Elevation and ice help reduce swelling and relieve pain. See your caregiver, or an orthopedic specialist, for follow-up care within the next 10 days. This is to make sure your fracture is healing properly. Document Released: 10/13/2005 Document Revised: 01/05/2012 Document Reviewed: 04/02/2007 [facility] Patient Information 2015 [place]ExitCare, [place]LL[place]. This information is not intended to replace advice given to you by your health care provider. Make sure you discuss any questions you have with your health care provider.  Ice and elevation will help with pain and swelling.  You may take the hydrocodone prescribed for pain relief.  This will make you drowsy - do not drive within 4 hours of taking this medication.
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
## 2014-05-18 NOTE — ED Provider Notes (Signed)
Medical screening examination/treatment/procedure(s) were performed by non-physician practitioner and as supervising physician I was immediately available for consultation/collaboration.   EKG Interpretation None       Broc Caspers, MD 05/18/14 1555 

## 2019-09-21 ENCOUNTER — Emergency Department (HOSPITAL_COMMUNITY)
Admission: EM | Admit: 2019-09-21 | Discharge: 2019-09-21 | Disposition: A | Discharge status: Home / Self Care | Payer: Self-pay | Attending: Emergency Medicine | Admitting: Emergency Medicine

## 2019-09-21 ENCOUNTER — Encounter (HOSPITAL_COMMUNITY): Payer: Self-pay | Admitting: Emergency Medicine

## 2019-09-21 ENCOUNTER — Other Ambulatory Visit: Payer: Self-pay

## 2019-09-21 DIAGNOSIS — H1031 Unspecified acute conjunctivitis, right eye: Secondary | ICD-10-CM | POA: Insufficient documentation

## 2019-09-21 DIAGNOSIS — F17210 Nicotine dependence, cigarettes, uncomplicated: Secondary | ICD-10-CM | POA: Insufficient documentation

## 2019-09-21 DIAGNOSIS — Z79899 Other long term (current) drug therapy: Secondary | ICD-10-CM | POA: Insufficient documentation

## 2019-09-21 MED ORDER — ERYTHROMYCIN 5 MG/GM OP OINT: TOPICAL_OINTMENT | OPHTHALMIC | 0 refills | Status: DC

## 2019-09-21 MED ORDER — ERYTHROMYCIN 5 MG/GM OP OINT
1.0000 "application " | TOPICAL_OINTMENT | Freq: Four times a day (QID) | OPHTHALMIC | Status: DC
  Administered 2019-09-21: 1 via OPHTHALMIC
  Filled 2019-09-21: qty 3.5

## 2019-09-21 MED ORDER — TETRACAINE HCL 0.5 % OP SOLN
2.0000 [drp] | Freq: Once | OPHTHALMIC | Status: AC
  Administered 2019-09-21: 2 [drp] via OPHTHALMIC

## 2019-09-21 MED ORDER — FLUORESCEIN SODIUM 1 MG OP STRP
1.0000 | ORAL_STRIP | Freq: Once | OPHTHALMIC | Status: AC
  Administered 2019-09-21: 1 via OPHTHALMIC
  Filled 2019-09-21: qty 1

## 2019-09-21 NOTE — ED Triage Notes (Signed)
Pt c/o of right eye irritation, drainage x 3 days

## 2019-09-21 NOTE — ED Provider Notes (Signed)
[facility] EMERGENCY DEPARTMENT Provider Note   CSN: [id number] Arrival date & time: 09/21/19  3875     History   Chief Complaint Chief Complaint  Patient presents with  . Eye Problem    HPI Charles Herrera is a 43 y.o. male      The history is provided by the patient.  Eye Problem Location:  Right eye Quality:  Foreign body sensation Onset quality:  Sudden Duration:  2 days Timing:  Constant Progression:  Worsening Chronicity:  New Context: smoke exposure   Context: not burn, not chemical exposure, not contact lens problem, not direct trauma, not foreign body, not using machinery and not UV exposure   Context comment:  Patient thinks his dread scratched his eye Relieved by:  Nothing Worsened by:  Bright light Ineffective treatments:  None tried Associated symptoms: crusting, discharge, photophobia and redness   Associated symptoms: no blurred vision, no decreased vision, no double vision, no facial rash, no headaches, no inflammation, no itching, no nausea, no numbness, no scotomas, no swelling, no tearing, no tingling, no vomiting and no weakness   Risk factors: no conjunctival hemorrhage, no previous injury to eye, no recent herpes zoster and no recent URI  [place]keye: no known.     History reviewed. No pertinent past medical history.  There are no active problems to display for this patient.   History reviewed. No pertinent surgical history.      Home Medications    Prior to Admission medications   Medication Sig Start Date End Date Taking? Authorizing Provider  erythromycin ophthalmic ointment Place a 1/2 inch ribbon of ointment into the lower eyelid. 09/21/19   Margarita Mail, PA-C  HYDROcodone-acetaminophen (NORCO/VICODIN) 5-325 MG per tablet Take 1-2 tablets by mouth every 6 (six) hours as needed for moderate pain. 05/11/14   Evalee Jefferson, PA-C  ibuprofen (ADVIL,MOTRIN) 600 MG tablet Take 1 tablet (600 mg total) by mouth every 8 (eight) hours as needed for  moderate pain (and inflammation). 05/11/14   Evalee Jefferson, PA-C    Family History History reviewed. No pertinent family history.  Social History Social History   Tobacco Use  . Smoking status: Current Every Day Smoker    Packs/day: 0.50    Types: Cigarettes  . Smokeless tobacco: Never Used  Substance Use Topics  . Alcohol use: Yes  . Drug use: No     Allergies   Patient has no known allergies.   Review of Systems Review of Systems  Constitutional: Negative.   Eyes: Positive for photophobia, discharge and redness. Negative for blurred vision, double vision, pain, itching and visual disturbance.  Gastrointestinal: Negative for nausea and vomiting.  Neurological: Negative for tingling, weakness, numbness and headaches.     Physical Exam Updated Vital Signs BP (!) 136/104   Pulse (!) 105   Temp 98.1 F (36.7 C) (Oral)   Resp 15   Ht 5\' 10"  (1.778 m)   Wt 90.7 kg   SpO2 98%   BMI 28.70 kg/m   Physical Exam   ED Treatments / Results  Labs (all labs ordered are listed, but only abnormal results are displayed) Labs Reviewed - No data to display  EKG None  Radiology No results found.  Procedures Procedures (including critical care time)  Medications Ordered in ED Medications  erythromycin ophthalmic ointment 1 application (1 application Right Eye Given 09/21/19 0921)  fluorescein ophthalmic strip 1 strip (1 strip Right Eye Given 09/21/19 0854)  tetracaine (PONTOCAINE) 0.5 % ophthalmic solution 2  drop (2 drops Both Eyes Given 09/21/19 0903)     Initial Impression / Assessment and Plan / ED Course  I have reviewed the triage vital signs and the nursing notes.  Pertinent labs & imaging results that were available during my care of the patient were reviewed by me and considered in my medical decision making (see chart for details).        Patient with Scleral injection and mild chemosis No recent eye trauma or suspected microtrauma (dust, sand, etc).  Negative Seidel sign. No significant photophobia or dendritic lesions. Patient does not wear contact lenses.  20/20 vision OD/OS and BL Given history and exam I have low suspicion for corneal abrasion or ulcer, globe rupture, uveitis, HSV keratitis, Endopthalmitis, Retinal Detachment, Angle Closure Glaucoma, Foreign Body. Patient appears to have conjunctivitis. Will treat with erythromycin ointment. Discussed OP fu and return precautions. Final Clinical Impressions(s) / ED Diagnoses   Final diagnoses:  Acute conjunctivitis of right eye, unspecified acute conjunctivitis type    ED Discharge Orders         Ordered    erythromycin ophthalmic ointment     09/21/19 0915           Arthor Captain, PA-C 09/21/19 1133    Geoffery Lyons, MD 09/21/19 1452

## 2019-09-21 NOTE — ED Notes (Signed)
Visual Acuity 20/20

## 2019-09-21 NOTE — Discharge Instructions (Addendum)
Apply a 1/2 inch strip into the lower lid, close your eye and roll your eye around with the lid closed to coat the eye. Apply the ointment 4 times a day for 5 days.  Contact a health care provider if: Your symptoms do not improve with treatment or they get worse. You have increased pain. Your vision becomes blurry. You have a fever. You have facial pain, redness, or swelling. You have yellow or green drainage coming from your eye. You have new symptoms.

## 2020-02-04 ENCOUNTER — Emergency Department (HOSPITAL_COMMUNITY): Payer: Self-pay

## 2020-02-04 ENCOUNTER — Encounter (HOSPITAL_COMMUNITY): Payer: Self-pay | Admitting: Emergency Medicine

## 2020-02-04 ENCOUNTER — Other Ambulatory Visit: Payer: Self-pay

## 2020-02-04 ENCOUNTER — Emergency Department (HOSPITAL_COMMUNITY)
Admission: EM | Admit: 2020-02-04 | Discharge: 2020-02-04 | Disposition: A | Discharge status: Home / Self Care | Payer: Self-pay | Attending: Emergency Medicine | Admitting: Emergency Medicine

## 2020-02-04 DIAGNOSIS — I10 Essential (primary) hypertension: Secondary | ICD-10-CM | POA: Insufficient documentation

## 2020-02-04 DIAGNOSIS — Z79899 Other long term (current) drug therapy: Secondary | ICD-10-CM | POA: Insufficient documentation

## 2020-02-04 DIAGNOSIS — J069 Acute upper respiratory infection, unspecified: Secondary | ICD-10-CM | POA: Insufficient documentation

## 2020-02-04 DIAGNOSIS — F17210 Nicotine dependence, cigarettes, uncomplicated: Secondary | ICD-10-CM | POA: Insufficient documentation

## 2020-02-04 HISTORY — DX: Essential (primary) hypertension: I10

## 2020-02-04 MED ORDER — BENZONATATE 100 MG PO CAPS: 100.0000 mg | ORAL_CAPSULE | Freq: Three times a day (TID) | ORAL | 0 refills | Status: DC | PRN

## 2020-02-04 MED ORDER — ALBUTEROL SULFATE HFA 108 (90 BASE) MCG/ACT IN AERS
1.0000 | INHALATION_SPRAY | Freq: Once | RESPIRATORY_TRACT | Status: AC
  Administered 2020-02-04: 12:00:00 2 via RESPIRATORY_TRACT
  Filled 2020-02-04: qty 6.7

## 2020-02-04 NOTE — ED Triage Notes (Signed)
Pt. C/o of chest congestion x 2 days. Has tried mucinex without relief.

## 2020-02-04 NOTE — ED Provider Notes (Signed)
[facility] EMERGENCY DEPARTMENT Provider Note   CSN: [id number] Arrival date & time: 02/04/20  1058     History Chief Complaint  Patient presents with  . chest congestion    Charles Herrera is a 44 y.o. male with past medical history significant for hypertension, tobacco abuse presents to emergency department today with chief complaint of chest congestion x2 days. He endorses productive cough with white phlegm. He has history of seasonal allergies but admits it has not bothered him since he was a child. He has tried taking mucinex with minimal symptom relief. He admits to intermittent wheezing and nasal congestion also. He used his significant other's inhaler with symptom relief. He denies any sick contacts. Denies fever, chills, headache, neck pain, difficulty breathing, chest pain, abdominal pain, nausea, vomiting, rash, loss of sense of taste and smell. No known covid exposures.  History provided by patient with additional history obtained from chart review.       Past Medical History:  Diagnosis Date  . Hypertension     There are no problems to display for this patient.   History reviewed. No pertinent surgical history.     No family history on file.  Social History   Tobacco Use  . Smoking status: Current Every Day Smoker    Packs/day: 0.50    Types: Cigarettes  . Smokeless tobacco: Never Used  Substance Use Topics  . Alcohol use: Yes    Alcohol/week: 2.0 standard drinks    Types: 2 Shots of liquor per week    Comment: occ  . Drug use: Yes    Types: Marijuana    Comment: occ    Home Medications Prior to Admission medications   Medication Sig Start Date End Date Taking? Authorizing Provider  benzonatate (TESSALON) 100 MG capsule Take 1 capsule (100 mg total) by mouth every 8 (eight) hours as needed for cough. 02/04/20   Albrizze, Kaitlyn E, PA-C  erythromycin ophthalmic ointment Place a 1/2 inch ribbon of ointment into the lower eyelid. 09/21/19   Arthor Captain, PA-C  HYDROcodone-acetaminophen (NORCO/VICODIN) 5-325 MG per tablet Take 1-2 tablets by mouth every 6 (six) hours as needed for moderate pain. 05/11/14   Burgess Amor, PA-C  ibuprofen (ADVIL,MOTRIN) 600 MG tablet Take 1 tablet (600 mg total) by mouth every 8 (eight) hours as needed for moderate pain (and inflammation). 05/11/14   Burgess Amor, PA-C    Allergies    Patient has no known allergies.  Review of Systems   Review of Systems  All other systems are reviewed and are negative for acute change except as noted in the HPI.   Physical Exam Updated Vital Signs BP (!) 145/106 (BP Location: Right Arm)   Pulse 92   Temp 98.2 F (36.8 C) (Oral)   Resp 16   Ht 5\' 10"  (1.778 m)   Wt 90.7 kg   SpO2 94%   BMI 28.70 kg/m   Physical Exam Vitals and nursing note reviewed.  Constitutional:      Appearance: He is well-developed. He is not ill-appearing or toxic-appearing.  HENT:     Head: Normocephalic and atraumatic.     Comments: No sinus or temporal tenderness.     Right Ear: Tympanic membrane and external ear normal.     Left Ear: Tympanic membrane and external ear normal.     Nose: Nose normal.     Mouth/Throat:     Comments: No erythema to oropharynx, no edema, no exudate, no tonsillar swelling, voice  normal, neck supple without lymphadenopathy  Eyes:     General: No scleral icterus.       Right eye: No discharge.        Left eye: No discharge.     Conjunctiva/sclera: Conjunctivae normal.  Neck:     Vascular: No JVD.  Cardiovascular:     Rate and Rhythm: Normal rate and regular rhythm.     Pulses: Normal pulses.     Heart sounds: Normal heart sounds.  Pulmonary:     Effort: Pulmonary effort is normal.     Comments: Patient with expiratory wheeze that clears after cough. No rales or rhonchi heard. Symmetric chest rise.  Chest:     Chest wall: No tenderness.  Abdominal:     General: There is no distension.  Musculoskeletal:        General: Normal range of  motion.     Cervical back: Normal range of motion.  Skin:    General: Skin is warm and dry.     Findings: No rash.  Neurological:     Mental Status: He is oriented to person, place, and time.     GCS: GCS eye subscore is 4. GCS verbal subscore is 5. GCS motor subscore is 6.     Comments: Fluent speech, no facial droop.  Psychiatric:        Behavior: Behavior normal.     ED Results / Procedures / Treatments   Labs (all labs ordered are listed, but only abnormal results are displayed) Labs Reviewed - No data to display  EKG None  Radiology DG Chest Portable 1 View  Result Date: 02/04/2020 CLINICAL DATA:  Cough and congestion 2 days. EXAM: PORTABLE CHEST 1 VIEW COMPARISON:  04/04/2014 FINDINGS: Lungs are adequately inflated as lordotic technique is demonstrated. There is no focal consolidation, effusion or pneumothorax. Cardiomediastinal silhouette and remainder of the exam is unchanged. IMPRESSION: No active disease. Electronically Signed   By: Marin Olp M.D.   On: 02/04/2020 12:45    Procedures Procedures (including critical care time)  Medications Ordered in ED Medications  albuterol (VENTOLIN HFA) 108 (90 Base) MCG/ACT inhaler 1-2 puff (2 puffs Inhalation Given 02/04/20 1226)    ED Course  I have reviewed the triage vital signs and the nursing notes.  Pertinent labs & imaging results that were available during my care of the patient were reviewed by me and considered in my medical decision making (see chart for details).    MDM Rules/Calculators/A&P                      Patient presents with URI type symptoms.  Patient is nontoxic appearing, in no apparent distress, vitals are WNL. Patient is afebrile in the ED, he does have faint expiratory wheeze on exam but it clears after coughing.  Chest x-ray viewed by me is negative for infiltrate, doubt pneumonia. Sxs onset < 7 days, afebrile, no sinus tenderness, doubt acute bacterial sinusitis. Centor score 0, doubt strep  pharyngitis. No evidence of AOM on exam. No meningeal signs. No history components or rashes to raise concern for tic borne illness. Suspect viral vs allergic etiology at this time and will treat supportively with Ibuprofen, Flonase, and Tessalon. I discussed results, treatment plan, need for PCP follow-up, and return precautions with the patient. Provided opportunity for questions, patient confirmed understanding and is in agreement with plan.    Portions of this note were generated with [employer]. Dictation errors may occur despite  best attempts at proofreading.   Final Clinical Impression(s) / ED Diagnoses Final diagnoses:  Upper respiratory tract infection, unspecified type    Rx / DC Orders ED Discharge Orders         Ordered    benzonatate (TESSALON) 100 MG capsule  Every 8 hours PRN     02/04/20 1309           Sherene Sires, PA-C 02/04/20 1340    Vanetta Mulders, MD 02/05/20 [phone]

## 2020-02-04 NOTE — Discharge Instructions (Addendum)
You were seen in the emergency today for upper respiratory symptoms, we suspect your symptoms are related to allergies or a virus at this time. There is no cure. Antibiotics are not effective, because the infection is caused by a virus, not by bacteria. I have prescribed you multiple medications to treat your symptoms.  ° °-Flonase to be used 1 spray in each nostril daily.  This medication is used to treat your congestion. ° °-Tessalon can be taken once every 8 hours as needed.  This medication is used to treat your cough. ° °-Ibuprofen to be taken once every 8 hours as needed for pain. Please take this medicine with food as it can cause stomach upset and at worst stomach bleeding. Do not take other NSAIDs such as motrin, aleve, advil, naproxen, mobic, etc as they are similar. You make take tylenol per over the counter dosing with this medicine safely. ° °More Symptomatic Treatments Options: °Treatment is directed at relieving symptoms.  Treatment may include:  °Increased fluid intake. Sports drinks offer valuable electrolytes, sugars, and fluids.  °Breathing heated mist or steam (vaporizer or shower).  °Eating chicken soup or other clear broths, and maintaining good nutrition.  °Getting plenty of rest.  °Using gargles or lozenges for comfort.  °Controlling fevers with ibuprofen or acetaminophen as directed by your caregiver.  °Increasing usage of your inhaler if you have asthma.  °Return to work when your temperature has returned to normal. ° °  °You will need to follow-up with your primary care provider in 1 week if your symptoms have not improved.  If you do not have a primary care provider one is provided in your discharge instructions.  Return to the emergency department for any new or worsening symptoms including but not limited to persistent fever for 5 days, difficulty breathing, chest pain, rashes, passing out, or any other concerns.  ° °

## 2022-01-24 ENCOUNTER — Emergency Department (HOSPITAL_COMMUNITY)
Admission: EM | Admit: 2022-01-24 | Discharge: 2022-01-24 | Disposition: A | Discharge status: Home / Self Care | Payer: Self-pay | Attending: Emergency Medicine | Admitting: Emergency Medicine

## 2022-01-24 ENCOUNTER — Encounter (HOSPITAL_COMMUNITY): Payer: Self-pay | Admitting: Emergency Medicine

## 2022-01-24 ENCOUNTER — Emergency Department (HOSPITAL_COMMUNITY): Payer: Self-pay

## 2022-01-24 DIAGNOSIS — J069 Acute upper respiratory infection, unspecified: Secondary | ICD-10-CM | POA: Insufficient documentation

## 2022-01-24 DIAGNOSIS — I10 Essential (primary) hypertension: Secondary | ICD-10-CM | POA: Insufficient documentation

## 2022-01-24 DIAGNOSIS — Z720 Tobacco use: Secondary | ICD-10-CM

## 2022-01-24 DIAGNOSIS — F17210 Nicotine dependence, cigarettes, uncomplicated: Secondary | ICD-10-CM | POA: Insufficient documentation

## 2022-01-24 DIAGNOSIS — J9801 Acute bronchospasm: Secondary | ICD-10-CM | POA: Insufficient documentation

## 2022-01-24 MED ORDER — PREDNISONE 50 MG PO TABS
60.0000 mg | ORAL_TABLET | Freq: Once | ORAL | Status: AC
  Administered 2022-01-24: 60 mg via ORAL
  Filled 2022-01-24: qty 1

## 2022-01-24 MED ORDER — IPRATROPIUM-ALBUTEROL 0.5-2.5 (3) MG/3ML IN SOLN
3.0000 mL | Freq: Once | RESPIRATORY_TRACT | Status: AC
  Administered 2022-01-24: 3 mL via RESPIRATORY_TRACT
  Filled 2022-01-24: qty 3

## 2022-01-24 MED ORDER — ALBUTEROL SULFATE HFA 108 (90 BASE) MCG/ACT IN AERS
2.0000 | INHALATION_SPRAY | RESPIRATORY_TRACT | Status: DC | PRN
  Filled 2022-01-24: qty 6.7

## 2022-01-24 MED ORDER — PREDNISONE 20 MG PO TABS: 60.0000 mg | ORAL_TABLET | Freq: Every day | ORAL | 0 refills | Status: DC

## 2022-01-24 NOTE — Discharge Instructions (Addendum)
Please check your blood pressure once a day.  If your blood pressure is consistently high, you will need to be on medication to control it.  Blood pressure that is not adequately controlled can lead to heart attacks, strokes, kidney failure. ?

## 2022-01-24 NOTE — ED Triage Notes (Signed)
Pt c/o congestion for the past week with cough. Coughing up clear and yellow mucus.  ?

## 2022-01-24 NOTE — Progress Notes (Signed)
Moderate expiratory wheezing.  Patient given neb treatment and inhaler. ?

## 2022-01-24 NOTE — ED Provider Notes (Signed)
?[place] ?Provider Note ? ? ?CSN: [id number] ?Arrival date & time: 01/24/22  0126 ? ?  ? ?History ? ?Chief Complaint  ?Patient presents with  ? Nasal Congestion  ? ? ?Charles Herrera is a 46 y.o. male. ? ?The history is provided by the patient.  ?He has a history of hypertension, currently not on medication, and comes in with about a 1 week history of cough which is productive of clear to slightly yellow sputum.  He has been having shortness of breath and wheezing which is getting worse.  He denies fever, chills, sweats.  He denies nausea or vomiting or diarrhea.  He denies arthralgias or myalgias.  He denies any sick contacts.  There has been no treatment at home.  He does smoke cigarettes although he states that he has been smoking less since he has been sick. ?  ?Home Medications ?Prior to Admission medications   ?Medication Sig Start Date End Date Taking? Authorizing Provider  ?benzonatate (TESSALON) 100 MG capsule Take 1 capsule (100 mg total) by mouth every 8 (eight) hours as needed for cough. 02/04/20   Barrie Folk, PA-C  ?erythromycin ophthalmic ointment Place a 1/2 inch ribbon of ointment into the lower eyelid. 09/21/19   Margarita Mail, PA-C  ?HYDROcodone-acetaminophen (NORCO/VICODIN) 5-325 MG per tablet Take 1-2 tablets by mouth every 6 (six) hours as needed for moderate pain. 05/11/14   Evalee Jefferson, PA-C  ?ibuprofen (ADVIL,MOTRIN) 600 MG tablet Take 1 tablet (600 mg total) by mouth every 8 (eight) hours as needed for moderate pain (and inflammation). 05/11/14   Evalee Jefferson, PA-C  ?   ? ?Allergies    ?Patient has no known allergies.   ? ?Review of Systems   ?Review of Systems  ?All other systems reviewed and are negative. ? ?Physical Exam ?Updated Vital Signs ?BP (!) 158/112   Pulse 78   Temp 98.2 ?F (36.8 ?C) (Oral)   Resp 17   Ht 5\' 10"  (1.778 m)   Wt 90.7 kg   SpO2 95%   BMI 28.69 kg/m?  ?Physical Exam ?Vitals and nursing note reviewed.  ?46 year old male, resting  comfortably and in no acute distress. Vital signs are significant for elevated blood pressure. Oxygen saturation is 95%, which is normal. ?Head is normocephalic and atraumatic. PERRLA, EOMI. Oropharynx is clear. ?Neck is nontender and supple without adenopathy or JVD. ?Back is nontender and there is no CVA tenderness. ?Lungs have diffuse inspiratory and expiratory wheezes.  There are no rales or rhonchi. ?Chest is nontender. ?Heart has regular rate and rhythm without murmur. ?Abdomen is soft, flat, nontender. ?Extremities have no cyanosis or edema, full range of motion is present. ?Skin is warm and dry without rash. ?Neurologic: Mental status is normal, cranial nerves are intact, moves all extremities equally. ? ?ED Results / Procedures / Treatments   ? ?Radiology ?DG Chest 2 View ? ?Result Date: 01/24/2022 ?CLINICAL DATA:  46 year old male with cough and congestion for 1 week. Smoker EXAM: CHEST - 2 VIEW COMPARISON:  Chest radiographs 02/04/2020 and earlier. FINDINGS: PA and lateral views at 0341 hours. Lung volumes and mediastinal contours remain normal. Visualized tracheal air column is within normal limits. Lung markings appear stable since 2015. No pneumothorax, pleural effusion or confluent pulmonary opacity. No acute osseous abnormality identified. Negative visible bowel gas. IMPRESSION: No acute cardiopulmonary abnormality. Electronically Signed   By: Genevie Ann M.D.   On: 01/24/2022 04:19   ? ?Procedures ?Procedures  ? ? ?Medications  Ordered in ED ?Medications  ?ipratropium-albuterol (DUONEB) 0.5-2.5 (3) MG/3ML nebulizer solution 3 mL (has no administration in time range)  ?predniSONE (DELTASONE) tablet 60 mg (has no administration in time range)  ? ? ?ED Course/ Medical Decision Making/ A&P ?  ?                        ?Medical Decision Making ?Amount and/or Complexity of Data Reviewed ?Radiology: ordered. ? ?Risk ?Prescription drug management. ? ? ?Respiratory tract infection with bronchospasm.  We will check  chest x-ray to rule out pneumonia.  He is given a dose of prednisone and will be given an nebulizer treatment with albuterol and ipratropium.  Elevated blood pressure in patient with history of hypertension.  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  He is encouraged to monitor his blood pressure as an outpatient, but will not start medication today.  He is also encouraged to stop smoking.  Counseling is given regarding need to stop smoking and potential ways to proceed.  Old records are reviewed, and he had a similar presentation 2 years ago, also had mildly elevated blood pressure at that time. ? ?Chest x-ray shows no evidence of pneumonia.  I have independently viewed the images, and agree with the radiologist's interpretation.  Following nebulizer treatment, lungs are completely clear.  Patient feels much better.  He is discharged with an albuterol inhaler and prescription for prednisone.  Encouraged outpatient blood pressure monitoring and tobacco cessation. ? ?Final Clinical Impression(s) / ED Diagnoses ?Final diagnoses:  ?Viral URI with cough  ?Bronchospasm  ?Tobacco abuse  ?Elevated blood pressure reading with diagnosis of hypertension  ? ? ?Rx / DC Orders ?ED Discharge Orders   ? ?      Ordered  ?  predniSONE (DELTASONE) 20 MG tablet  Daily       ? 01/24/22 0445  ? ?  ?  ? ?  ? ? ?  ?Delora Fuel, MD ?[id number] [phone] ? ?

## 2022-03-23 ENCOUNTER — Emergency Department (HOSPITAL_COMMUNITY)
Admission: EM | Admit: 2022-03-23 | Discharge: 2022-03-23 | Disposition: A | Discharge status: Home / Self Care | Payer: Self-pay | Attending: Emergency Medicine | Admitting: Emergency Medicine

## 2022-03-23 ENCOUNTER — Encounter (HOSPITAL_COMMUNITY): Payer: Self-pay

## 2022-03-23 ENCOUNTER — Other Ambulatory Visit: Payer: Self-pay

## 2022-03-23 ENCOUNTER — Emergency Department (HOSPITAL_COMMUNITY): Payer: Self-pay

## 2022-03-23 DIAGNOSIS — R0602 Shortness of breath: Secondary | ICD-10-CM | POA: Insufficient documentation

## 2022-03-23 DIAGNOSIS — R079 Chest pain, unspecified: Secondary | ICD-10-CM | POA: Insufficient documentation

## 2022-03-23 DIAGNOSIS — I10 Essential (primary) hypertension: Secondary | ICD-10-CM | POA: Insufficient documentation

## 2022-03-23 DIAGNOSIS — Z79899 Other long term (current) drug therapy: Secondary | ICD-10-CM | POA: Insufficient documentation

## 2022-03-23 LAB — COMPREHENSIVE METABOLIC PANEL
ALT: 28 U/L (ref 0–44)
AST: 25 U/L (ref 15–41)
Albumin: 4.5 g/dL (ref 3.5–5.0)
Alkaline Phosphatase: 67 U/L (ref 38–126)
Anion gap: 7 (ref 5–15)
BUN: 12 mg/dL (ref 6–20)
CO2: 22 mmol/L (ref 22–32)
Calcium: 8.9 mg/dL (ref 8.9–10.3)
Chloride: 107 mmol/L (ref 98–111)
Creatinine, Ser: 0.85 mg/dL (ref 0.61–1.24)
GFR, Estimated: >60 mL/min (ref >60)
Glucose, Bld: 130 mg/dL — ABNORMAL HIGH (ref 70–99)
Potassium: 3.8 mmol/L (ref 3.5–5.1)
Sodium: 136 mmol/L (ref 135–145)
Total Bilirubin: 0.5 mg/dL (ref 0.3–1.2)
Total Protein: 7.5 g/dL (ref 6.5–8.1)

## 2022-03-23 LAB — CBC
HCT: 48.6 % (ref 39.0–52.0)
Hemoglobin: 17.1 g/dL — ABNORMAL HIGH (ref 13.0–17.0)
MCH: 33.8 pg (ref 26.0–34.0)
MCHC: 35.2 g/dL (ref 30.0–36.0)
MCV: 96 fL (ref 80.0–100.0)
Platelets: 191 10*3/uL (ref 150–400)
RBC: 5.06 MIL/uL (ref 4.22–5.81)
RDW: 14.3 % (ref 11.5–15.5)
WBC: 7.2 10*3/uL (ref 4.0–10.5)
nRBC: 0 % (ref 0.0–0.2)

## 2022-03-23 LAB — MAGNESIUM: Magnesium: 2.1 mg/dL (ref 1.7–2.4)

## 2022-03-23 LAB — D-DIMER, QUANTITATIVE: D-Dimer, Quant: <0.27 ug/mL-FEU (ref 0.00–0.50)

## 2022-03-23 LAB — PROTIME-INR
INR: 1.1 (ref 0.8–1.2)
Prothrombin Time: 14 seconds (ref 11.4–15.2)

## 2022-03-23 LAB — TROPONIN I (HIGH SENSITIVITY)
Troponin I (High Sensitivity): 5 ng/L (ref <18)
Troponin I (High Sensitivity): 4 ng/L (ref <18)

## 2022-03-23 LAB — BRAIN NATRIURETIC PEPTIDE: B Natriuretic Peptide: 14 pg/mL (ref 0.0–100.0)

## 2022-03-23 LAB — LIPASE, BLOOD: Lipase: 101 U/L — ABNORMAL HIGH (ref 11–51)

## 2022-03-23 MED ORDER — FENTANYL CITRATE PF 50 MCG/ML IJ SOSY
50.0000 ug | PREFILLED_SYRINGE | Freq: Once | INTRAMUSCULAR | Status: AC
  Administered 2022-03-23: 50 ug via INTRAVENOUS
  Filled 2022-03-23: qty 1

## 2022-03-23 MED ORDER — ALBUTEROL SULFATE HFA 108 (90 BASE) MCG/ACT IN AERS
1.0000 | INHALATION_SPRAY | Freq: Once | RESPIRATORY_TRACT | Status: DC
Start: 2022-03-23 — End: 2022-03-23
  Filled 2022-03-23: qty 6.7

## 2022-03-23 MED ORDER — ASPIRIN 81 MG PO CHEW
324.0000 mg | CHEWABLE_TABLET | Freq: Once | ORAL | Status: AC
  Administered 2022-03-23: 324 mg via ORAL
  Filled 2022-03-23: qty 4

## 2022-03-23 MED ORDER — NITROGLYCERIN 2 % TD OINT
1.0000 [in_us] | TOPICAL_OINTMENT | Freq: Once | TRANSDERMAL | Status: AC
  Administered 2022-03-23: 1 [in_us] via TOPICAL
  Filled 2022-03-23: qty 1

## 2022-03-23 MED ORDER — AMLODIPINE BESYLATE 5 MG PO TABS
10.0000 mg | ORAL_TABLET | Freq: Once | ORAL | Status: AC
  Administered 2022-03-23: 10 mg via ORAL
  Filled 2022-03-23: qty 2

## 2022-03-23 MED ORDER — AMLODIPINE BESYLATE 10 MG PO TABS: 10.0000 mg | ORAL_TABLET | Freq: Every day | ORAL | 2 refills | Status: AC

## 2022-03-23 NOTE — Discharge Instructions (Signed)
A prescription for amlodipine was sent to your pharmacy.  Take this as prescribed for management of high blood pressure.  There is a telephone number below to call to establish a primary care doctor.  He will be important to monitor your response to the medication and need for any dose changes or addition of any other medicines.  Return to the emergency department at any time if you experience any new symptoms of concern.

## 2022-03-23 NOTE — ED Notes (Signed)
MD notified re: pts BP and request for prescription for Amlodipine,  pt states, " I have been out for a while. I think I had a prescription for 15 mg."

## 2022-03-23 NOTE — ED Provider Notes (Signed)
[facility] EMERGENCY DEPARTMENT Provider Note   CSN: [id number] Arrival date & time: 03/23/22  3474     History  Chief Complaint  Patient presents with   Chest Pain    Charles Herrera is a 46 y.o. male.  HPI Patient presenting for chest pain.  Pain is central in location.  It is described as a pressure.  Pain/pressure does not radiate.  Onset was last night and it did wake him up from sleep.  It has persisted this morning.  Currently it is 8/10 in severity.  Patient also endorses some shortness of breath.  He denies any nausea or diaphoresis.  He is only known medical problem is hypertension.  He does not take daily medications.  Patient does continue to smoke.  He does not have any known family history of early ACS. HPI: A 46 year old patient with a history of hypertension presents for evaluation of chest pain. Initial onset of pain was less than one hour ago. The patient's chest pain is described as heaviness/pressure/tightness and is not worse with exertion. The patient's chest pain is middle- or left-sided, is not well-localized, is not sharp and does not radiate to the arms/jaw/neck. The patient does not complain of nausea and denies diaphoresis. The patient has smoked in the past 90 days. The patient has no history of stroke, has no history of peripheral artery disease, denies any history of treated diabetes, has no relevant family history of coronary artery disease (first degree relative at less than age 57), has no history of hypercholesterolemia and does not have an elevated BMI (>=30).   Home Medications Prior to Admission medications   Medication Sig Start Date End Date Taking? Authorizing Provider  amLODipine (NORVASC) 10 MG tablet Take 1 tablet (10 mg total) by mouth daily. 03/23/22 06/21/22 Yes Gloris Manchester, MD  predniSONE (DELTASONE) 20 MG tablet Take 3 tablets (60 mg total) by mouth daily. 01/24/22   Dione Booze, MD      Allergies    Patient has no known allergies.    Review  of Systems   Review of Systems  Respiratory:  Positive for shortness of breath.   Cardiovascular:  Positive for chest pain.  All other systems reviewed and are negative.  Physical Exam Updated Vital Signs BP (!) 141/113   Pulse 65   Temp 97.9 F (36.6 C) (Oral)   Resp (!) 23   Ht 5\' 10"  (1.778 m)   Wt 93 kg   SpO2 97%   BMI 29.41 kg/m  Physical Exam Vitals and nursing note reviewed.  Constitutional:      General: He is not in acute distress.    Appearance: He is well-developed and normal weight. He is not ill-appearing, toxic-appearing or diaphoretic.  HENT:     Head: Normocephalic and atraumatic.  Eyes:     Extraocular Movements: Extraocular movements intact.     Conjunctiva/sclera: Conjunctivae normal.  Neck:     Vascular: No JVD.  Cardiovascular:     Rate and Rhythm: Normal rate and regular rhythm.     Heart sounds: No murmur heard. Pulmonary:     Effort: Pulmonary effort is normal. No respiratory distress.     Breath sounds: Normal breath sounds. No decreased breath sounds, wheezing, rhonchi or rales.  Chest:     Chest wall: No tenderness.  Abdominal:     Palpations: Abdomen is soft.     Tenderness: There is no abdominal tenderness.  Musculoskeletal:        General:  No swelling. Normal range of motion.     Cervical back: Normal range of motion and neck supple.     Right lower leg: No edema.     Left lower leg: No edema.  Skin:    General: Skin is warm and dry.     Coloration: Skin is not cyanotic or pale.  Neurological:     General: No focal deficit present.     Mental Status: He is alert and oriented to person, place, and time.     Cranial Nerves: No cranial nerve deficit.     Motor: No weakness.  Psychiatric:        Mood and Affect: Mood normal.        Behavior: Behavior normal.    ED Results / Procedures / Treatments   Labs (all labs ordered are listed, but only abnormal results are displayed) Labs Reviewed  CBC - Abnormal; Notable for the  following components:      Result Value   Hemoglobin 17.1 (*)    All other components within normal limits  LIPASE, BLOOD - Abnormal; Notable for the following components:   Lipase 101 (*)    All other components within normal limits  COMPREHENSIVE METABOLIC PANEL - Abnormal; Notable for the following components:   Glucose, Bld 130 (*)    All other components within normal limits  MAGNESIUM  BRAIN NATRIURETIC PEPTIDE  PROTIME-INR  D-DIMER, QUANTITATIVE  TROPONIN I (HIGH SENSITIVITY)  TROPONIN I (HIGH SENSITIVITY)    EKG EKG Interpretation  Date/Time:  Sunday Mar 23 2022 09:27:23 EDT Ventricular Rate:  76 PR Interval:  178 QRS Duration: 89 QT Interval:  388 QTC Calculation: 437 R Axis:   59 Text Interpretation: Sinus rhythm RSR' in V1 or V2, probably normal variant ST elev, probable normal early repol pattern Confirmed by Gloris Manchester [phone]) on 03/23/2022 10:14:12 AM  Radiology DG Chest Portable 1 View  Result Date: 03/23/2022 CLINICAL DATA:  Patient reports chest tightness/pressure since last night. Left side of chest with associated SOB. Hx nonsmoker EXAM: PORTABLE CHEST 1 VIEW COMPARISON:  01/24/2022. FINDINGS: Normal heart, mediastinum and hila. Clear lungs.  No pleural effusion or pneumothorax. Skeletal structures are grossly intact. IMPRESSION: No active disease. Electronically Signed   By: Amie Portland M.D.   On: 03/23/2022 09:59    Procedures Procedures    Medications Ordered in ED Medications  aspirin chewable tablet 324 mg (324 mg Oral Given 03/23/22 0950)  nitroGLYCERIN (NITROGLYN) 2 % ointment 1 inch (1 inch Topical Given 03/23/22 0951)  fentaNYL (SUBLIMAZE) injection 50 mcg (50 mcg Intravenous Given 03/23/22 0953)  amLODipine (NORVASC) tablet 10 mg (10 mg Oral Given 03/23/22 1204)    ED Course/ Medical Decision Making/ A&P   HEAR Score: 3                       Medical Decision Making Amount and/or Complexity of Data Reviewed Labs: ordered. Radiology:  ordered.  Risk OTC drugs. Prescription drug management.   This patient presents to the ED for concern of chest pain, this involves an extensive number of treatment options, and is a complaint that carries with it a high risk of complications and morbidity.  The differential diagnosis includes ACS, pericarditis, pneumonia, GERD, PE   Co morbidities that complicate the patient evaluation  HTN, smoking   Additional history obtained:  Additional history obtained from patient's significant other External records from outside source obtained and reviewed including EMR   Lab Tests:  I Ordered, and personally interpreted labs.  The pertinent results include: Normal electrolytes, normal troponins, negative D-dimer, normal hemoglobin, no leukocytosis, lipase mildly elevated but not consistent with pancreatitis   Imaging Studies ordered:  I ordered imaging studies including chest x-ray I independently visualized and interpreted imaging which showed no acute findings I agree with the radiologist interpretation   Cardiac Monitoring: / EKG:  The patient was maintained on a cardiac monitor.  I personally viewed and interpreted the cardiac monitored which showed an underlying rhythm of: Sinus rhythm   Problem List / ED Course / Critical interventions / Medication management  Patient is a 46 year old male presenting for chest pain.  Onset was last night.  Patient describes the pain as a pressure type sensation that is midsternal.  It does not radiate.  He endorses mild shortness of breath but denies any nausea.  EKG on arrival does not show any evidence of ST segment changes.  Patient's vital signs are notable for hypertension.  He has been diagnosed with hypertension in the past and prescribed amlodipine which she has not taken in several years.  Patient's additional risk factors include current smoking.  Patient was given ASA, NTG, and fentanyl.  Laboratory work-up was initiated.  Chest  x-ray did not show any acute findings.  Lab results were reassuring.  Patient had negative troponins x2.  Based on symptoms, risk factors, and EKG findings, patient is a heart score of 3.  On reassessment, patient reports complete resolution of chest pain.  Blood pressure, notably DBP, remains elevated.  Patient does state that he would like to resume taking amlodipine.  Dose of amlodipine was given in the ED and patient was given prescription for continued daily medication.  He was advised to establish primary care to follow-up for continued management of hypertension.  He was advised to return to the ED at any time for any worsening of symptoms.  He was discharged in good condition. I ordered medication including ASA, NTG, and fentanyl for possible ACS; amlodipine for hypertension Reevaluation of the patient after these medicines showed that the patient resolved I have reviewed the patients home medicines and have made adjustments as needed   Social Determinants of Health:  Does not currently have a PCP        Final Clinical Impression(s) / ED Diagnoses Final diagnoses:  Chest pain, unspecified type    Rx / DC Orders ED Discharge Orders          Ordered    amLODipine (NORVASC) 10 MG tablet  Daily        03/23/22 1242              Gloris Manchesterixon, Hyman Crossan, MD 03/24/22 [phone]

## 2022-03-23 NOTE — ED Triage Notes (Signed)
Patient reports chest tightness/pressure since last night. Left side of chest with associated SHOB.

## 2022-07-14 ENCOUNTER — Other Ambulatory Visit: Payer: Self-pay

## 2022-07-14 ENCOUNTER — Emergency Department (HOSPITAL_COMMUNITY)
Admission: EM | Admit: 2022-07-14 | Discharge: 2022-07-14 | Disposition: A | Discharge status: Home / Self Care | Payer: Self-pay | Attending: Emergency Medicine | Admitting: Emergency Medicine

## 2022-07-14 ENCOUNTER — Encounter (HOSPITAL_COMMUNITY): Payer: Self-pay | Admitting: Emergency Medicine

## 2022-07-14 ENCOUNTER — Emergency Department (HOSPITAL_COMMUNITY): Payer: Self-pay

## 2022-07-14 DIAGNOSIS — J45909 Unspecified asthma, uncomplicated: Secondary | ICD-10-CM | POA: Insufficient documentation

## 2022-07-14 DIAGNOSIS — I10 Essential (primary) hypertension: Secondary | ICD-10-CM | POA: Insufficient documentation

## 2022-07-14 DIAGNOSIS — J069 Acute upper respiratory infection, unspecified: Secondary | ICD-10-CM | POA: Insufficient documentation

## 2022-07-14 DIAGNOSIS — Z79899 Other long term (current) drug therapy: Secondary | ICD-10-CM | POA: Insufficient documentation

## 2022-07-14 DIAGNOSIS — Z20822 Contact with and (suspected) exposure to covid-19: Secondary | ICD-10-CM | POA: Insufficient documentation

## 2022-07-14 LAB — SARS CORONAVIRUS 2 BY RT PCR: SARS Coronavirus 2 by RT PCR: NEGATIVE

## 2022-07-14 MED ORDER — BENZONATATE 200 MG PO CAPS: 200.0000 mg | ORAL_CAPSULE | Freq: Three times a day (TID) | ORAL | 0 refills | Status: AC | PRN

## 2022-07-14 MED ORDER — AMLODIPINE BESYLATE 5 MG PO TABS
10.0000 mg | ORAL_TABLET | Freq: Once | ORAL | Status: AC
  Administered 2022-07-14: 10 mg via ORAL
  Filled 2022-07-14: qty 2

## 2022-07-14 MED ORDER — ALBUTEROL SULFATE HFA 108 (90 BASE) MCG/ACT IN AERS
2.0000 | INHALATION_SPRAY | Freq: Once | RESPIRATORY_TRACT | Status: AC
  Administered 2022-07-14: 2 via RESPIRATORY_TRACT
  Filled 2022-07-14: qty 6.7

## 2022-07-14 MED ORDER — PREDNISONE 20 MG PO TABS: 40.0000 mg | ORAL_TABLET | Freq: Every day | ORAL | 0 refills | Status: DC

## 2022-07-14 NOTE — ED Provider Notes (Signed)
[facility] EMERGENCY DEPARTMENT Provider Note   CSN: [id number] Arrival date & time: 07/14/22  1600     History  Chief Complaint  Patient presents with   Cough    Charles Herrera is a 46 y.o. male.   Cough Associated symptoms: rhinorrhea and wheezing   Associated symptoms: no chest pain, no chills, no ear pain, no fever, no headaches, no myalgias, no shortness of breath and no sore throat        Charles Herrera is a 46 y.o. male with past medical history of hypertension who presents to the Emergency Department complaining of cough, wheezing, and chest congestion x2 days.  He has noticed a worsening wheezing and tightness of his chest.  Endorses cough that is occasionally productive of colored sputum.  Has been using his fiance's albuterol inhaler with temporary relief.  He notes history of asthma although this is not documented in his medical record.  He also endorses some nasal congestion.  No known fever or chills.  No abdominal pain, nausea or vomiting.  No chest pain or shortness of breath.  No known COVID exposures.   Home Medications Prior to Admission medications   Medication Sig Start Date End Date Taking? Authorizing Provider  amLODipine (NORVASC) 10 MG tablet Take 1 tablet (10 mg total) by mouth daily. 03/23/22 06/21/22  Gloris Manchester, MD  predniSONE (DELTASONE) 20 MG tablet Take 3 tablets (60 mg total) by mouth daily. 01/24/22   Dione Booze, MD      Allergies    Patient has no known allergies.    Review of Systems   Review of Systems  Constitutional:  Negative for appetite change, chills and fever.  HENT:  Positive for congestion and rhinorrhea. Negative for ear pain, sore throat and trouble swallowing.   Respiratory:  Positive for cough and wheezing. Negative for shortness of breath.   Cardiovascular:  Negative for chest pain.  Gastrointestinal:  Negative for abdominal pain, diarrhea, nausea and vomiting.  Genitourinary:  Negative for dysuria.  Musculoskeletal:   Negative for arthralgias and myalgias.  Neurological:  Negative for dizziness, syncope, weakness, numbness and headaches.    Physical Exam Updated Vital Signs BP (!) 148/118 (BP Location: Right Arm)   Pulse 77   Temp 98.7 F (37.1 C) (Oral)   Resp 17   Ht 5\' 11"  (1.803 m)   Wt 95.3 kg   SpO2 96%   BMI 29.29 kg/m  Physical Exam Vitals and nursing note reviewed.  Constitutional:      General: He is not in acute distress.    Appearance: Normal appearance. He is not toxic-appearing.  HENT:     Right Ear: Tympanic membrane and ear canal normal.     Left Ear: Tympanic membrane and ear canal normal.     Mouth/Throat:     Mouth: Mucous membranes are moist.     Pharynx: Oropharynx is clear. No oropharyngeal exudate or posterior oropharyngeal erythema.  Cardiovascular:     Rate and Rhythm: Normal rate and regular rhythm.     Pulses: Normal pulses.  Pulmonary:     Effort: Pulmonary effort is normal. No respiratory distress.     Breath sounds: No wheezing.     Comments: Mild to moderate expiratory wheezes bilaterally, no increased work of breathing on my exam. Abdominal:     Palpations: Abdomen is soft.     Tenderness: There is no abdominal tenderness.  Musculoskeletal:     Right lower leg: No edema.  Left lower leg: No edema.  Lymphadenopathy:     Cervical: No cervical adenopathy.  Skin:    General: Skin is warm.     Capillary Refill: Capillary refill takes less than 2 seconds.     Findings: No rash.  Neurological:     General: No focal deficit present.     Mental Status: He is alert.     Sensory: No sensory deficit.     Motor: No weakness.     ED Results / Procedures / Treatments   Labs (all labs ordered are listed, but only abnormal results are displayed) Labs Reviewed  SARS CORONAVIRUS 2 BY RT PCR    EKG None  Radiology DG Chest Portable 1 View  Result Date: 07/14/2022 CLINICAL DATA:  cough, wheezing EXAM: PORTABLE CHEST 1 VIEW COMPARISON:  Chest x-ray  Mar 23, 2022. FINDINGS: The heart size and mediastinal contours are within normal limits. Both lungs are clear. No visible pleural effusions or pneumothorax. No acute osseous abnormality. IMPRESSION: No active disease. Electronically Signed   By: Margaretha Sheffield M.D.   On: 07/14/2022 18:03    Procedures Procedures    Medications Ordered in ED Medications - No data to display  ED Course/ Medical Decision Making/ A&P                           Medical Decision Making Patient here for evaluation of cough and congestion x2 days.  He notes history of asthma although I do not appreciate this in his medical record.  Has been using his fiance's albuterol inhaler with minimal relief.  No complaint of shortness of breath or chest pain.  No fever or chills.  On exam, patient well-appearing nontoxic.  He is hypertensive with history of same.  Admits to being noncompliant with his antihypertensive medication. expiratory wheezes bilaterally on exam   Denies chest pain.  Differential diagnosis would include but not limited to viral respiratory process, COVID, pneumonia, asthma exacerbation, PE, ACS.  Clinically, I have low suspicion for ACS given lack of chest pain, he is PERC negative so clinical suspicion for PE is also low.  Amount and/or Complexity of Data Reviewed External Data Reviewed: notes.    Details: On review of patient's medical records, he has been noted to be hypertensive on multiple ER visits.  Normal kidney function in May of this year Radiology: ordered.    Details: CXR negative Discussion of management or test interpretation with external provider(s): Patient here with complaint of wheezing and cough and congestion x2 days.  No fever.  No known COVID exposures.  He notes history of asthma although I do not appreciate this in his medical record.  He is hypertensive, noted to be hypertensive on previous ER visits.  He admits to being noncompliant with his medication.  He has amlodipine at  home.  He was given amlodipine 10 mg here.  Patient counseled on importance of proper control of his blood pressure.  He is agreeable to plan.  Likely URI, will treat with albuterol and steroid.  Given return precautions  Risk Prescription drug management.           Final Clinical Impression(s) / ED Diagnoses Final diagnoses:  Viral URI with cough  Hypertension, unspecified type    Rx / DC Orders ED Discharge Orders     None         Kem Parkinson, PA-C 07/17/22 1423    Noemi Chapel, MD 07/28/22 1315

## 2022-07-14 NOTE — Discharge Instructions (Signed)
Your chest x-ray today did not show evidence of a pneumonia.  Your COVID test is pending.  You may review your results on MyChart.  Use 1 to 2 puffs of the albuterol inhaler every 4-6 hours as needed.  Take the prednisone as directed till finished.  Also,  your blood pressure today is very elevated.  This increases your risk for heart attacks and strokes as well as organ damage.  It is important that you take your blood pressure medications every day as directed.  Please follow-up with your primary care provider for recheck.  Return emergency department for any new or worsening symptoms.

## 2022-07-14 NOTE — ED Triage Notes (Signed)
Pt to the ED with complaints of cough and congestion for the last two days.

## 2022-09-20 ENCOUNTER — Encounter (HOSPITAL_COMMUNITY): Payer: Self-pay

## 2022-09-20 ENCOUNTER — Other Ambulatory Visit: Payer: Self-pay

## 2022-09-20 ENCOUNTER — Emergency Department (HOSPITAL_COMMUNITY)
Admission: EM | Admit: 2022-09-20 | Discharge: 2022-09-20 | Disposition: A | Discharge status: Home / Self Care | Payer: 59 | Attending: Emergency Medicine | Admitting: Emergency Medicine

## 2022-09-20 DIAGNOSIS — I10 Essential (primary) hypertension: Secondary | ICD-10-CM | POA: Insufficient documentation

## 2022-09-20 DIAGNOSIS — J449 Chronic obstructive pulmonary disease, unspecified: Secondary | ICD-10-CM | POA: Insufficient documentation

## 2022-09-20 DIAGNOSIS — J45909 Unspecified asthma, uncomplicated: Secondary | ICD-10-CM | POA: Diagnosis not present

## 2022-09-20 DIAGNOSIS — R0981 Nasal congestion: Secondary | ICD-10-CM | POA: Insufficient documentation

## 2022-09-20 DIAGNOSIS — Z79899 Other long term (current) drug therapy: Secondary | ICD-10-CM | POA: Diagnosis not present

## 2022-09-20 DIAGNOSIS — F17210 Nicotine dependence, cigarettes, uncomplicated: Secondary | ICD-10-CM | POA: Diagnosis not present

## 2022-09-20 DIAGNOSIS — R0602 Shortness of breath: Secondary | ICD-10-CM | POA: Diagnosis not present

## 2022-09-20 DIAGNOSIS — J209 Acute bronchitis, unspecified: Secondary | ICD-10-CM

## 2022-09-20 HISTORY — DX: Unspecified asthma, uncomplicated: J45.909

## 2022-09-20 MED ORDER — PREDNISONE 20 MG PO TABS
40.0000 mg | ORAL_TABLET | Freq: Once | ORAL | Status: AC
Start: 2022-09-20 — End: 2022-09-20
  Administered 2022-09-20: 40 mg via ORAL
  Filled 2022-09-20: qty 2

## 2022-09-20 MED ORDER — AZITHROMYCIN 250 MG PO TABS
500.0000 mg | ORAL_TABLET | Freq: Once | ORAL | Status: AC
  Administered 2022-09-20: 500 mg via ORAL
  Filled 2022-09-20: qty 2

## 2022-09-20 MED ORDER — ALBUTEROL SULFATE HFA 108 (90 BASE) MCG/ACT IN AERS
2.0000 | INHALATION_SPRAY | Freq: Once | RESPIRATORY_TRACT | Status: AC
  Administered 2022-09-20: 2 via RESPIRATORY_TRACT
  Filled 2022-09-20: qty 6.7

## 2022-09-20 MED ORDER — PREDNISONE 10 MG PO TABS: 20.0000 mg | ORAL_TABLET | Freq: Two times a day (BID) | ORAL | 0 refills | Status: DC

## 2022-09-20 MED ORDER — AZITHROMYCIN 250 MG PO TABS: 250.0000 mg | ORAL_TABLET | Freq: Every day | ORAL | 0 refills | Status: DC

## 2022-09-20 MED ORDER — IPRATROPIUM-ALBUTEROL 0.5-2.5 (3) MG/3ML IN SOLN
3.0000 mL | Freq: Once | RESPIRATORY_TRACT | Status: AC
  Administered 2022-09-20: 3 mL via RESPIRATORY_TRACT
  Filled 2022-09-20: qty 3

## 2022-09-20 NOTE — ED Triage Notes (Signed)
Pt c/o SOB, states he used his inhaler with relief. Hx of asthma. Productive cough for 2 weeks.

## 2022-09-20 NOTE — Discharge Instructions (Signed)
Begin taking Zithromax and prednisone as prescribed.  Use the albuterol inhaler, 2 puffs every 4 hours as needed for wheezing.  Continue over-the-counter medications as needed for relief of symptoms.  Follow-up with primary doctor if not improving in the next week, and return to the ER if symptoms significantly worsen or change.

## 2022-09-20 NOTE — ED Provider Notes (Signed)
[facility] EMERGENCY DEPARTMENT Provider Note   CSN: [id number] Arrival date & time: 09/20/22  0405     History  Chief Complaint  Patient presents with   Shortness of Charlo is a 46 y.o. male.  Patient is a 46 year old male with past medical history of asthma/COPD, hypertension.  Patient presenting with a 2-week history of chest congestion, wheezing, and occasional productive cough.  He has been using his inhaler at home with little relief.  He denies any fevers or chills.  He denies any chest pain or leg swelling.  Patient does continue to smoke.  The history is provided by the patient.       Home Medications Prior to Admission medications   Medication Sig Start Date End Date Taking? Authorizing Provider  amLODipine (NORVASC) 10 MG tablet Take 1 tablet (10 mg total) by mouth daily. 03/23/22 06/21/22  Godfrey Pick, MD  benzonatate (TESSALON) 200 MG capsule Take 1 capsule (200 mg total) by mouth 3 (three) times daily as needed for cough. 07/14/22   Triplett, Tammy, PA-C  predniSONE (DELTASONE) 20 MG tablet Take 2 tablets (40 mg total) by mouth daily. 07/14/22   Triplett, Lynelle Smoke, PA-C      Allergies    Patient has no known allergies.    Review of Systems   Review of Systems  All other systems reviewed and are negative.   Physical Exam Updated Vital Signs BP (!) 127/97   Pulse 88   Temp 97.8 F (36.6 C)   Resp 20   Ht 5\' 11"  (1.803 m)   Wt 93 kg   SpO2 96%   BMI 28.59 kg/m  Physical Exam Vitals and nursing note reviewed.  Constitutional:      General: He is not in acute distress.    Appearance: He is well-developed. He is not diaphoretic.  HENT:     Head: Normocephalic and atraumatic.  Cardiovascular:     Rate and Rhythm: Normal rate and regular rhythm.     Heart sounds: No murmur heard.    No friction rub.  Pulmonary:     Effort: Pulmonary effort is normal. No respiratory distress.     Breath sounds: Examination of the right-middle field  reveals rhonchi. Examination of the left-middle field reveals rhonchi. Rhonchi present. No wheezing or rales.  Abdominal:     General: Bowel sounds are normal. There is no distension.     Palpations: Abdomen is soft.     Tenderness: There is no abdominal tenderness.  Musculoskeletal:        General: Normal range of motion.     Cervical back: Normal range of motion and neck supple.  Skin:    General: Skin is warm and dry.  Neurological:     Mental Status: He is alert and oriented to person, place, and time.     Coordination: Coordination normal.     ED Results / Procedures / Treatments   Labs (all labs ordered are listed, but only abnormal results are displayed) Labs Reviewed - No data to display  EKG None  Radiology No results found.  Procedures Procedures    Medications Ordered in ED Medications  azithromycin (ZITHROMAX) tablet 500 mg (has no administration in time range)  ipratropium-albuterol (DUONEB) 0.5-2.5 (3) MG/3ML nebulizer solution 3 mL (has no administration in time range)  predniSONE (DELTASONE) tablet 40 mg (has no administration in time range)    ED Course/ Medical Decision Making/ A&P  Patient presenting with a  2-week history of chest congestion and URI symptoms.  He is now having wheezing and difficulty breathing.  He arrives here with stable vital signs and is afebrile.  Oxygen saturation is 94% on room air.  On exam, he does have expiratory wheezing bilaterally.  Patient given a DuoNeb treatment along with prednisone and Zithromax in the ER.  He will be discharged with an albuterol MDI, and course of prednisone and Zithromax.  Final Clinical Impression(s) / ED Diagnoses Final diagnoses:  None    Rx / DC Orders ED Discharge Orders     None         Geoffery Lyons, MD 09/20/22 Jeralyn Bennett

## 2022-09-20 NOTE — ED Notes (Signed)
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  

## 2022-09-20 NOTE — ED Triage Notes (Deleted)
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

## 2023-05-16 IMAGING — DX DG CHEST 2V
2 series · 2 of 2 positions shown · non-contrast
Comparison: Chest radiographs 02/04/2020 and earlier.

CLINICAL DATA: 45-year-old male with cough and congestion for 1
week. Smoker

EXAM:
CHEST - 2 VIEW

[chest pa]
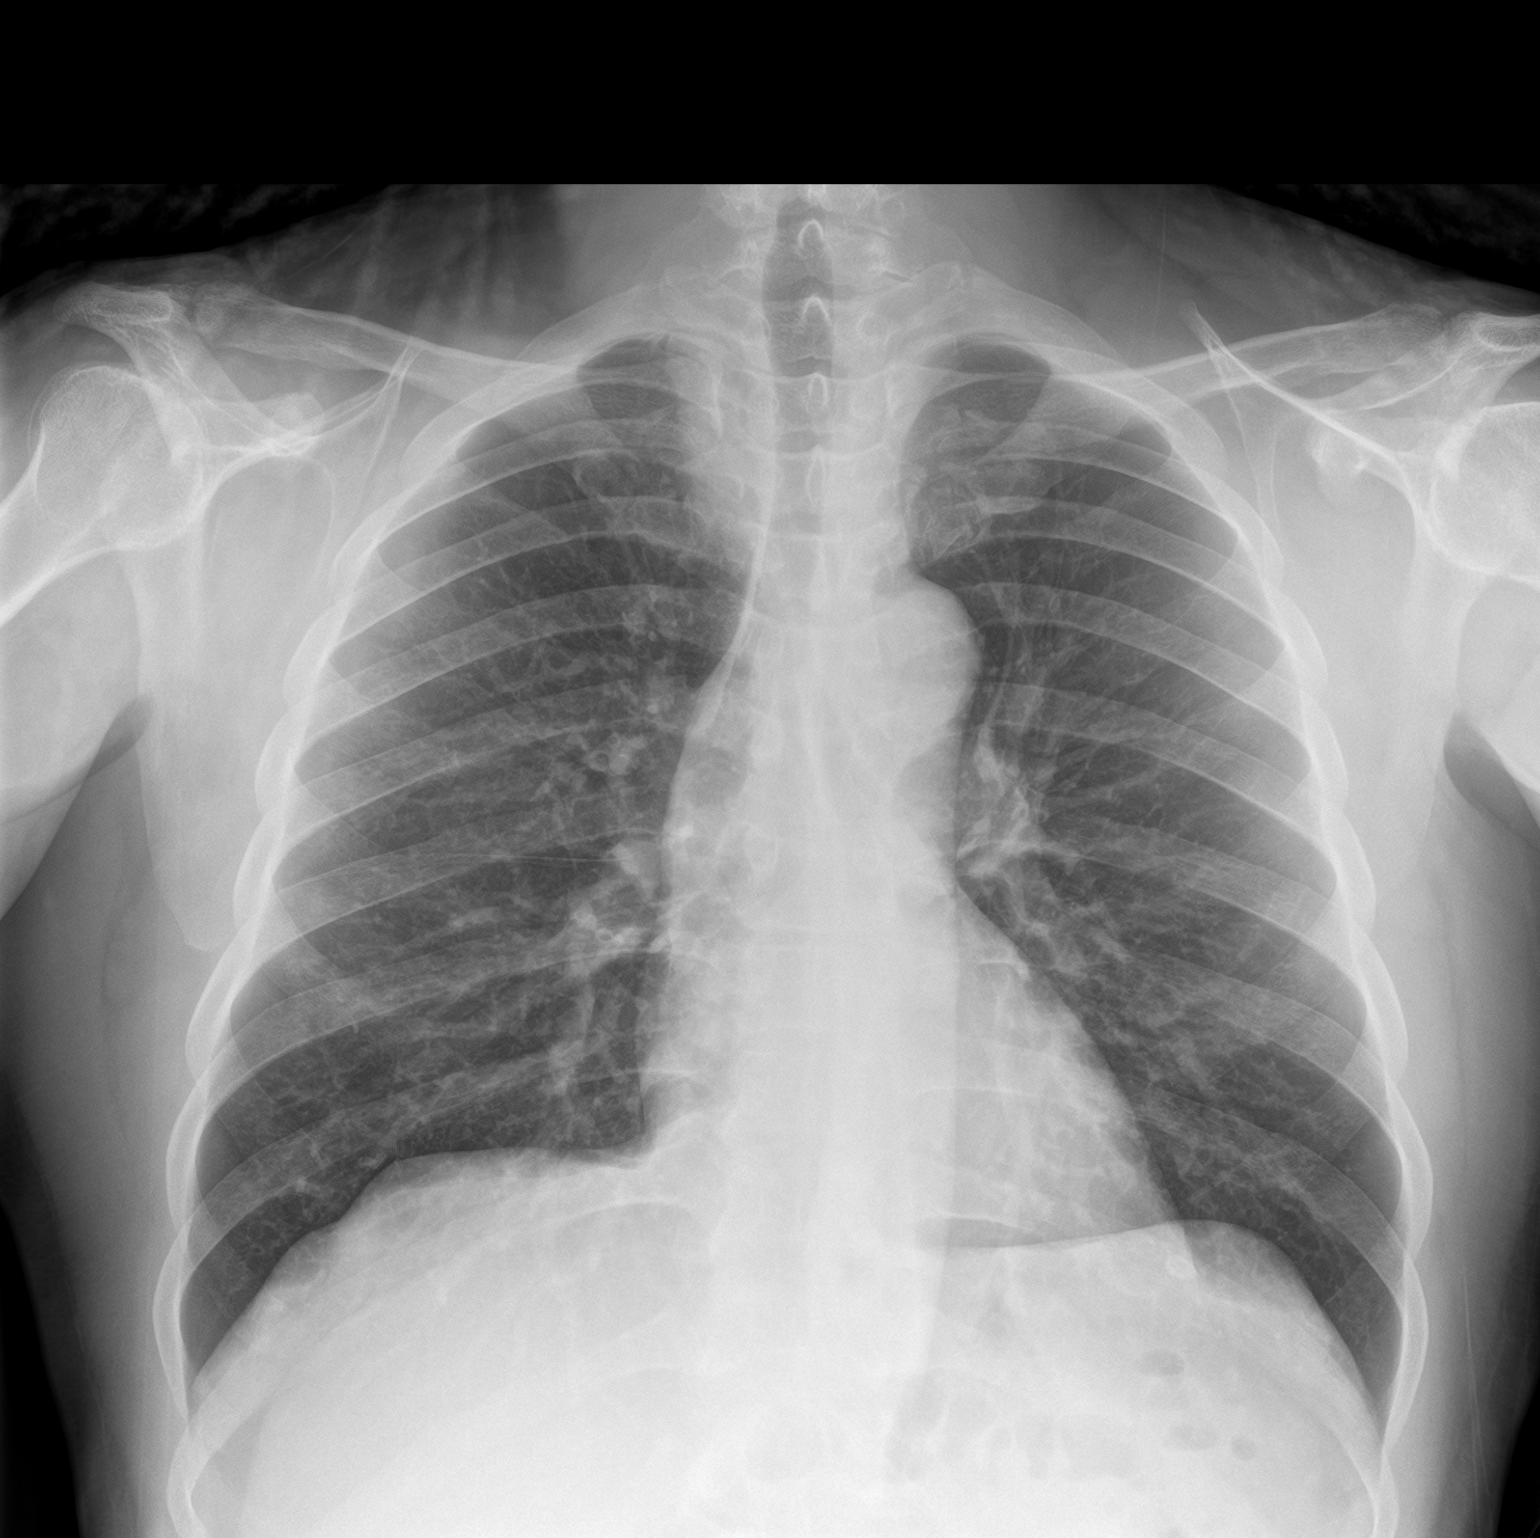

[chest lat]
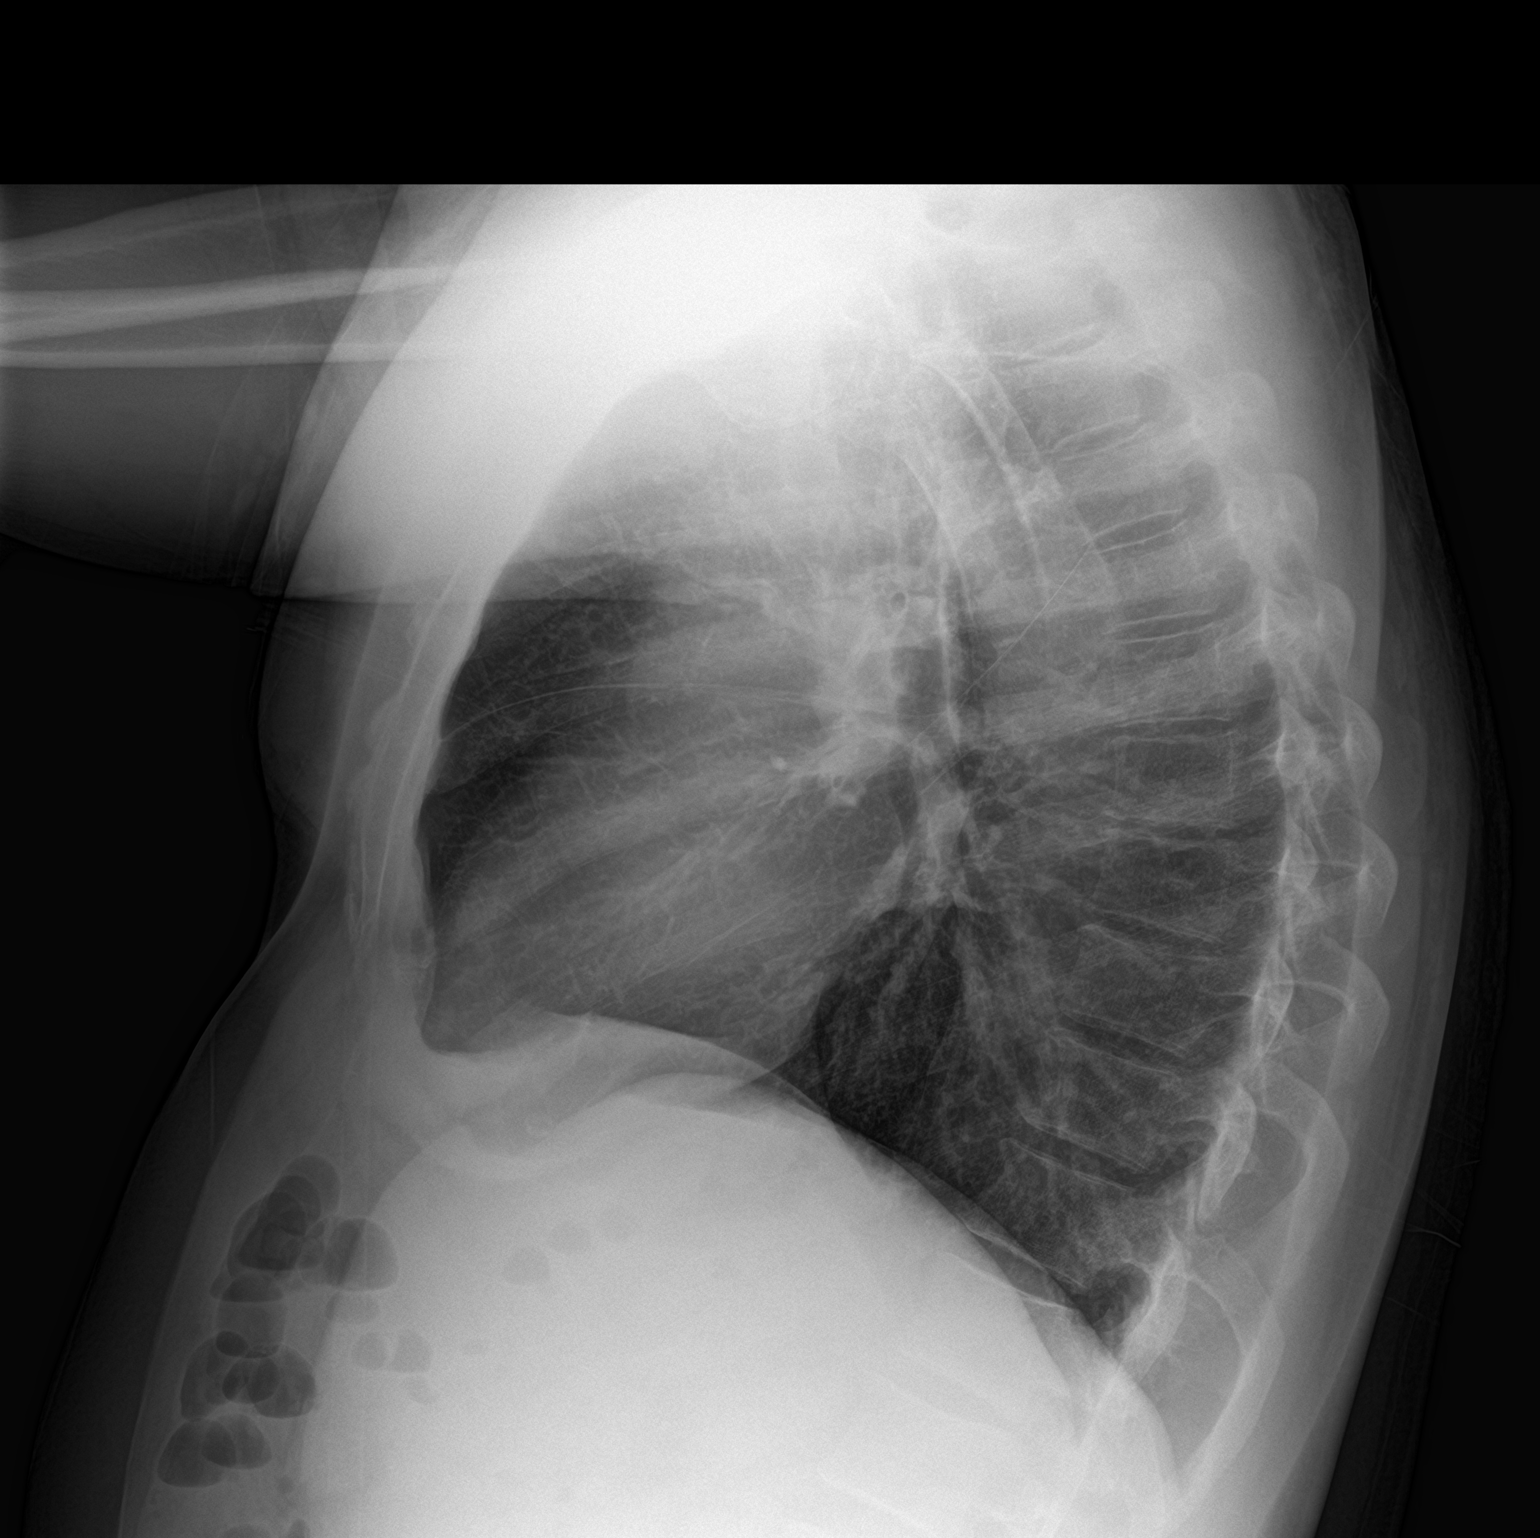

[2 of 2 positions shown; findings below may reference images not displayed]

FINDINGS: PA and lateral views at 5041 hours. Lung volumes and mediastinal
contours remain normal. Visualized tracheal air column is within
normal limits. Lung markings appear stable since 3577. No
pneumothorax, pleural effusion or confluent pulmonary opacity.

No acute osseous abnormality identified. Negative visible bowel gas.
IMPRESSION: No acute cardiopulmonary abnormality.

## 2023-08-07 ENCOUNTER — Other Ambulatory Visit: Payer: Self-pay

## 2023-08-07 ENCOUNTER — Encounter (HOSPITAL_COMMUNITY): Payer: Self-pay

## 2023-08-07 ENCOUNTER — Emergency Department (HOSPITAL_COMMUNITY)
Admission: EM | Admit: 2023-08-07 | Discharge: 2023-08-07 | Disposition: A | Discharge status: Home / Self Care | Payer: 59 | Attending: Emergency Medicine | Admitting: Emergency Medicine

## 2023-08-07 DIAGNOSIS — K047 Periapical abscess without sinus: Secondary | ICD-10-CM | POA: Insufficient documentation

## 2023-08-07 MED ORDER — AMOXICILLIN 500 MG PO CAPS: 500.0000 mg | ORAL_CAPSULE | Freq: Three times a day (TID) | ORAL | 0 refills | Status: AC

## 2023-08-07 MED ORDER — AMOXICILLIN 250 MG PO CAPS
500.0000 mg | ORAL_CAPSULE | Freq: Once | ORAL | Status: AC
  Administered 2023-08-07: 500 mg via ORAL
  Filled 2023-08-07: qty 2

## 2023-08-07 NOTE — Discharge Instructions (Signed)
Take the entire course of the antibiotics prescribed.  I do recommend you using caution with the quantity of ibuprofen, I recommend no more than 4 tablets every 8 hours.  You may also add acetaminophen (generic for tylenol) t #2 500 mg tablets every 8 hours, this combination can greatly relieve your dental pain.  I do recommend checking out  [facility] in [place] who provides low cost dental extractions.

## 2023-08-07 NOTE — ED Triage Notes (Signed)
Tooth pain x2-3 weeks  Bottom LEFT molar decay

## 2023-08-10 NOTE — ED Provider Notes (Addendum)
[place] EMERGENCY DEPARTMENT AT [facility] Provider Note   CSN: [id number] Arrival date & time: 08/07/23  1321     History  Chief Complaint  Patient presents with   Dental Pain    Charles Herrera is a 47 y.o. male.  The history is provided by the patient.  Dental Pain Location:  Lower Lower teeth location:  18/LL 2nd molar Quality:  Throbbing and shooting Severity:  Severe Onset quality:  Gradual Duration:  2 weeks Timing:  Constant Progression:  Worsening Chronicity:  New Context: dental caries   Relieved by:  Nothing Worsened by:  Cold food/drink and pressure Ineffective treatments:  NSAIDs (partially effective but is taking maximum doses) Associated symptoms: no difficulty swallowing, no facial swelling, no fever, no neck pain, no neck swelling and no trismus   Risk factors: lack of dental care and smoking        Home Medications Prior to Admission medications   Medication Sig Start Date End Date Taking? Authorizing Provider  amoxicillin (AMOXIL) 500 MG capsule Take 1 capsule (500 mg total) by mouth 3 (three) times daily. 08/07/23  Yes Ameya Kutz, Raynelle Fanning, PA-C  amLODipine (NORVASC) 10 MG tablet Take 1 tablet (10 mg total) by mouth daily. 03/23/22 06/21/22  Gloris Manchester, MD  azithromycin (ZITHROMAX) 250 MG tablet Take 1 tablet (250 mg total) by mouth daily. 09/20/22   Geoffery Lyons, MD  benzonatate (TESSALON) 200 MG capsule Take 1 capsule (200 mg total) by mouth 3 (three) times daily as needed for cough. 07/14/22   Triplett, Tammy, PA-C  predniSONE (DELTASONE) 10 MG tablet Take 2 tablets (20 mg total) by mouth 2 (two) times daily. 09/20/22   Geoffery Lyons, MD      Allergies    Patient has no known allergies.    Review of Systems   Review of Systems  Constitutional:  Negative for fever.  HENT:  Positive for dental problem. Negative for facial swelling and sore throat.   Respiratory:  Negative for shortness of breath.   Musculoskeletal:  Negative for neck pain  and neck stiffness.    Physical Exam Updated Vital Signs BP (!) 157/115 (BP Location: Right Arm)   Pulse 97   Temp 98.5 F (36.9 C) (Oral)   Resp 20   Ht 5\' 10"  (1.778 m)   Wt 90.7 kg   SpO2 99%   BMI 28.70 kg/m  Physical Exam Constitutional:      General: He is not in acute distress.    Appearance: He is well-developed.  HENT:     Head: Normocephalic and atraumatic.     Jaw: No trismus.     Right Ear: Tympanic membrane and external ear normal.     Left Ear: Tympanic membrane and external ear normal.     Mouth/Throat:     Mouth: Mucous membranes are moist. No oral lesions.     Dentition: Gingival swelling and dental caries present. No dental abscesses.     Pharynx: Oropharynx is clear. No posterior oropharyngeal erythema or uvula swelling.     Comments: Moderate decay with surrounding gingival edema left lower second molar.  No fluctuant abscess. Sublingual space soft, no trismus.  No cervical adenopathy. Eyes:     Conjunctiva/sclera: Conjunctivae normal.  Cardiovascular:     Rate and Rhythm: Normal rate.  Pulmonary:     Effort: Pulmonary effort is normal.  Musculoskeletal:        General: Normal range of motion.     Cervical back: Normal range  of motion and neck supple.  Lymphadenopathy:     Cervical: No cervical adenopathy.  Skin:    General: Skin is warm and dry.     Findings: No erythema.  Neurological:     Mental Status: He is alert and oriented to person, place, and time.     ED Results / Procedures / Treatments   Labs (all labs ordered are listed, but only abnormal results are displayed) Labs Reviewed - No data to display  EKG None  Radiology No results found.  Procedures Procedures    Medications Ordered in ED Medications  amoxicillin (AMOXIL) capsule 500 mg (500 mg Oral Given 08/07/23 2109)    ED Course/ Medical Decision Making/ A&P                                 Medical Decision Making Patient presenting with dental infection with  dental decay, there is no palpable drainable abscess suspect there may be brewing apical abscess present.  He is placed on antibiotics with first dose given here.  He is desirous of dental extraction, has reached out to local dentists but with no expeditious appointments.  I also suggested he check out [facility] in [place], he may be able to get this tooth addressed quickly.  Risk Prescription drug management.           Final Clinical Impression(s) / ED Diagnoses Final diagnoses:  Dental abscess    Rx / DC Orders ED Discharge Orders          Ordered    amoxicillin (AMOXIL) 500 MG capsule  3 times daily        08/07/23 2102              Burgess Amor, PA-C 08/10/23 1456    Burgess Amor, PA-C 08/10/23 1500    Vanetta Mulders, MD 08/14/23 [phone]

## 2024-10-06 ENCOUNTER — Emergency Department (HOSPITAL_COMMUNITY): Payer: Self-pay

## 2024-10-06 ENCOUNTER — Emergency Department (HOSPITAL_COMMUNITY)
Admission: EM | Admit: 2024-10-06 | Discharge: 2024-10-07 | Disposition: A | Discharge status: Home / Self Care | Payer: Self-pay | Attending: Emergency Medicine | Admitting: Emergency Medicine

## 2024-10-06 ENCOUNTER — Encounter (HOSPITAL_COMMUNITY): Payer: Self-pay | Admitting: Emergency Medicine

## 2024-10-06 ENCOUNTER — Other Ambulatory Visit: Payer: Self-pay

## 2024-10-06 DIAGNOSIS — J209 Acute bronchitis, unspecified: Secondary | ICD-10-CM | POA: Insufficient documentation

## 2024-10-06 DIAGNOSIS — J45909 Unspecified asthma, uncomplicated: Secondary | ICD-10-CM | POA: Insufficient documentation

## 2024-10-06 DIAGNOSIS — I10 Essential (primary) hypertension: Secondary | ICD-10-CM | POA: Insufficient documentation

## 2024-10-06 DIAGNOSIS — R059 Cough, unspecified: Secondary | ICD-10-CM | POA: Insufficient documentation

## 2024-10-06 DIAGNOSIS — Z79899 Other long term (current) drug therapy: Secondary | ICD-10-CM | POA: Insufficient documentation

## 2024-10-06 DIAGNOSIS — R0789 Other chest pain: Secondary | ICD-10-CM

## 2024-10-06 LAB — CBC
HCT: 52.1 % — ABNORMAL HIGH (ref 39.0–52.0)
Hemoglobin: 17.9 g/dL — ABNORMAL HIGH (ref 13.0–17.0)
MCH: 33 pg (ref 26.0–34.0)
MCHC: 34.4 g/dL (ref 30.0–36.0)
MCV: 95.9 fL (ref 80.0–100.0)
Platelets: 202 K/uL (ref 150–400)
RBC: 5.43 MIL/uL (ref 4.22–5.81)
RDW: 14.1 % (ref 11.5–15.5)
WBC: 7.5 K/uL (ref 4.0–10.5)
nRBC: 0 % (ref 0.0–0.2)

## 2024-10-06 LAB — TROPONIN T, HIGH SENSITIVITY: Troponin T High Sensitivity: <15 ng/L (ref 0–19)

## 2024-10-06 LAB — BASIC METABOLIC PANEL WITH GFR
Anion gap: 13 (ref 5–15)
BUN: 11 mg/dL (ref 6–20)
CO2: 23 mmol/L (ref 22–32)
Calcium: 9.6 mg/dL (ref 8.9–10.3)
Chloride: 102 mmol/L (ref 98–111)
Creatinine, Ser: 1 mg/dL (ref 0.61–1.24)
GFR, Estimated: >60 mL/min (ref >60)
Glucose, Bld: 110 mg/dL — ABNORMAL HIGH (ref 70–99)
Potassium: 4 mmol/L (ref 3.5–5.1)
Sodium: 137 mmol/L (ref 135–145)

## 2024-10-06 NOTE — ED Triage Notes (Addendum)
 Pt with c/o chest pressure, cough, and congestion. Pt states he is unable to cough up anything. Pt states he has an old inhaler for his asthma. Pt states chest pressure has gotten worse and also endorses sob.

## 2024-10-07 LAB — RESP PANEL BY RT-PCR (RSV, FLU A&B, COVID)  RVPGX2
Influenza A by PCR: NEGATIVE
Influenza B by PCR: NEGATIVE
Resp Syncytial Virus by PCR: NEGATIVE
SARS Coronavirus 2 by RT PCR: NEGATIVE

## 2024-10-07 MED ORDER — ALBUTEROL SULFATE HFA 108 (90 BASE) MCG/ACT IN AERS
2.0000 | INHALATION_SPRAY | Freq: Once | RESPIRATORY_TRACT | Status: AC
  Administered 2024-10-07: 2 via RESPIRATORY_TRACT
  Filled 2024-10-07: qty 6.7

## 2024-10-07 MED ORDER — AZITHROMYCIN 250 MG PO TABS
500.0000 mg | ORAL_TABLET | Freq: Once | ORAL | Status: AC
  Administered 2024-10-07: 500 mg via ORAL
  Filled 2024-10-07: qty 2

## 2024-10-07 MED ORDER — IPRATROPIUM-ALBUTEROL 0.5-2.5 (3) MG/3ML IN SOLN
3.0000 mL | Freq: Once | RESPIRATORY_TRACT | Status: AC
  Administered 2024-10-07: 3 mL via RESPIRATORY_TRACT
  Filled 2024-10-07: qty 3

## 2024-10-07 MED ORDER — AZITHROMYCIN 250 MG PO TABS: 250.0000 mg | ORAL_TABLET | Freq: Every day | ORAL | 0 refills | Status: AC

## 2024-10-07 MED ORDER — PREDNISONE 10 MG PO TABS: 20.0000 mg | ORAL_TABLET | Freq: Two times a day (BID) | ORAL | 0 refills | Status: AC

## 2024-10-07 MED ORDER — PREDNISONE 20 MG PO TABS
40.0000 mg | ORAL_TABLET | Freq: Once | ORAL | Status: AC
  Administered 2024-10-07: 40 mg via ORAL
  Filled 2024-10-07: qty 2

## 2024-10-07 NOTE — ED Provider Notes (Signed)
 [place] EMERGENCY DEPARTMENT AT [facility] Provider Note   CSN: [id number] Arrival date & time: 10/06/24  8040     Patient presents with: Chest Pain, Cough, and Shortness of Breath   Charles Herrera is a 48 y.o. male.   Patient is a 48 year old male with past medical history of asthma and hypertension.  Patient presenting today with complaints of chest congestion, chest tightness, and cough.  This has been worsening over the past week.  Cough is intermittently productive.  He denies fevers, but has felt chilled.  He denies any ill contacts.       Prior to Admission medications  Medication Sig Start Date End Date Taking? Authorizing Provider  amLODipine  (NORVASC ) 10 MG tablet Take 1 tablet (10 mg total) by mouth daily. 03/23/22 06/21/22  Melvenia Motto, MD  amoxicillin  (AMOXIL ) 500 MG capsule Take 1 capsule (500 mg total) by mouth 3 (three) times daily. 08/07/23   Idol, Julie, PA-C  azithromycin  (ZITHROMAX ) 250 MG tablet Take 1 tablet (250 mg total) by mouth daily. 09/20/22   Geroldine Berg, MD  benzonatate  (TESSALON ) 200 MG capsule Take 1 capsule (200 mg total) by mouth 3 (three) times daily as needed for cough. 07/14/22   Triplett, Tammy, PA-C  predniSONE  (DELTASONE ) 10 MG tablet Take 2 tablets (20 mg total) by mouth 2 (two) times daily. 09/20/22   Geroldine Berg, MD    Allergies: Patient has no known allergies.    Review of Systems  All other systems reviewed and are negative.   Updated Vital Signs BP (!) 155/119   Pulse 95   Temp 98.8 F (37.1 C)   Resp 19   Ht 5' 10 (1.778 m)   Wt 90.7 kg   SpO2 95%   BMI 28.70 kg/m   Physical Exam Vitals and nursing note reviewed.  Constitutional:      General: He is not in acute distress.    Appearance: He is well-developed. He is not diaphoretic.  HENT:     Head: Normocephalic and atraumatic.  Cardiovascular:     Rate and Rhythm: Normal rate and regular rhythm.     Heart sounds: No murmur heard.    No friction rub.   Pulmonary:     Effort: Pulmonary effort is normal. No respiratory distress.     Breath sounds: Normal breath sounds. No wheezing or rales.  Abdominal:     General: Bowel sounds are normal. There is no distension.     Palpations: Abdomen is soft.     Tenderness: There is no abdominal tenderness.  Musculoskeletal:        General: Normal range of motion.     Cervical back: Normal range of motion and neck supple.  Skin:    General: Skin is warm and dry.  Neurological:     Mental Status: He is alert and oriented to person, place, and time.     Coordination: Coordination normal.     (all labs ordered are listed, but only abnormal results are displayed) Labs Reviewed  BASIC METABOLIC PANEL WITH GFR - Abnormal; Notable for the following components:      Result Value   Glucose, Bld 110 (*)    All other components within normal limits  CBC - Abnormal; Notable for the following components:   Hemoglobin 17.9 (*)    HCT 52.1 (*)    All other components within normal limits  RESP PANEL BY RT-PCR (RSV, FLU A&B, COVID)  [id number]  TROPONIN T, HIGH SENSITIVITY  TROPONIN T, HIGH SENSITIVITY    EKG: EKG Interpretation Date/Time:  Thursday October 06 2024 20:31:44 EST Ventricular Rate:  82 PR Interval:  166 QRS Duration:  84 QT Interval:  374 QTC Calculation: 436 R Axis:   54  Text Interpretation: Normal sinus rhythm Cannot rule out Anterior infarct , age undetermined Abnormal ECG When compared with ECG of 23-Mar-2022 09:27, no significant change is noted Confirmed by Geroldine Berg ([id number]) on 10/06/2024 11:54:24 PM  Radiology: [facility] Chest 2 View Result Date: 10/06/2024 EXAM: 2 VIEW(S) XRAY OF THE CHEST 10/06/2024 08:50:00 PM COMPARISON: 07/14/2022. CLINICAL HISTORY: Cough; SOB (shortness of breath); Chest pain. FINDINGS: LUNGS AND PLEURA: No focal pulmonary opacity. No pleural effusion. No pneumothorax. HEART AND MEDIASTINUM: No acute abnormality of the cardiac and mediastinal silhouettes.  BONES AND SOFT TISSUES: No acute osseous abnormality. IMPRESSION: 1. No acute cardiopulmonary process. Electronically signed by: Franky Crease MD 10/06/2024 08:56 PM EST RP Workstation: [id number]     Procedures   Medications Ordered in the ED  ipratropium-albuterol  (DUONEB) 0.5-2.5 (3) MG/3ML nebulizer solution 3 mL (has no administration in time range)  albuterol  (VENTOLIN  HFA) 108 (90 Base) MCG/ACT inhaler 2 puff (has no administration in time range)                                    Medical Decision Making Amount and/or Complexity of Data Reviewed Labs: ordered.  Risk Prescription drug management.   Patient presenting here with URI symptoms as described in the HPI.  He describes chest congestion and cough, but no chest pain.  Symptoms have been worsening over the past week.  Patient arrives here with stable vital signs and is afebrile.  There is no hypoxia.  Laboratory studies obtained including CBC, basic metabolic panel, and troponin, all of which are negative.  Respiratory panel negative for COVID/flu/RSV.  Chest x-ray showed no acute process.  Patient has received a DuoNeb here in the ER and will be discharged with an albuterol  inhaler.  He has what appears to be bronchitis and will be treated with Zithromax  and prednisone .  Patient to return as needed for any problems.     Final diagnoses:  None    ED Discharge Orders     None          Geroldine Berg, MD 10/07/24 [phone]

## 2024-10-07 NOTE — ED Notes (Signed)
 Critical Results previously documented by Marolyn, RN were not the results of this pt but were the results of another pt on the unit. Dr Geroldine made aware.

## 2024-10-07 NOTE — Discharge Instructions (Signed)
 Begin taking Zithromax  and prednisone  as prescribed.  Use the albuterol  inhaler 2 puffs every 4 hours as needed for wheezing/difficulty breathing.  Return to the ER if your symptoms significantly worsen or change.
